# Patient Record
Sex: Male | Born: 1985 | Race: Black or African American | Hispanic: No | Marital: Single | Smoking: Never smoker
Health system: Southern US, Community
[De-identification: ages and names within clinical notes are randomized; demographics above are authoritative.]

## PROBLEM LIST (undated history)

## (undated) DIAGNOSIS — I10 Essential (primary) hypertension: Secondary | ICD-10-CM

---

## 2016-01-28 ENCOUNTER — Emergency Department (HOSPITAL_COMMUNITY)
Admission: EM | Admit: 2016-01-28 | Discharge: 2016-01-28 | Disposition: A | Discharge status: Home / Self Care | Payer: Self-pay | Attending: Emergency Medicine | Admitting: Emergency Medicine

## 2016-01-28 ENCOUNTER — Encounter (HOSPITAL_COMMUNITY): Payer: Self-pay | Admitting: Family Medicine

## 2016-01-28 DIAGNOSIS — A64 Unspecified sexually transmitted disease: Secondary | ICD-10-CM | POA: Insufficient documentation

## 2016-01-28 LAB — URINALYSIS, ROUTINE W REFLEX MICROSCOPIC
Bilirubin Urine: NEGATIVE
Glucose, UA: NEGATIVE mg/dL
Ketones, ur: 40 mg/dL — AB
NITRITE: NEGATIVE
PROTEIN: NEGATIVE mg/dL
Specific Gravity, Urine: 1.029 (ref 1.005–1.030)
pH: 5.5 (ref 5.0–8.0)

## 2016-01-28 LAB — URINE MICROSCOPIC-ADD ON: SQUAMOUS EPITHELIAL / LPF: NONE SEEN

## 2016-01-28 MED ORDER — AZITHROMYCIN 250 MG PO TABS
1000.0000 mg | ORAL_TABLET | Freq: Once | ORAL | Status: AC
  Administered 2016-01-28: 1000 mg via ORAL
  Filled 2016-01-28: qty 4

## 2016-01-28 MED ORDER — STERILE WATER FOR INJECTION IJ SOLN
INTRAMUSCULAR | Status: AC
  Filled 2016-01-28: qty 10

## 2016-01-28 MED ORDER — CEFTRIAXONE SODIUM 250 MG IJ SOLR
250.0000 mg | Freq: Once | INTRAMUSCULAR | Status: AC
  Administered 2016-01-28: 250 mg via INTRAMUSCULAR
  Filled 2016-01-28: qty 250

## 2016-01-28 NOTE — ED Provider Notes (Signed)
CSN: [id number]     Arrival date & time 01/28/16  1910 History  By signing my name below, I, Iona Beard, attest that this documentation has been prepared under the direction and in the presence of [employer], PA-C.  Electronically Signed: Iona Beard, ED Scribe 01/28/2016 at 8:54 PM.  Chief Complaint  Patient presents with  . Penile Discharge    The history is provided by the patient. No language interpreter was used.   HPI Comments: Brandon Cole is a 30 y.o. male who presents to the Emergency Department complaining of possible STD exposure after having unprotected sex with a male, two days ago. Pt reports associated dysuria and thin, brown-colored penile discharge. No other associated symptoms noted. No worsening or alleviating factors noted. Pt denies abdominal pain, testicular pain, penile pain, rectal pain, or any other pertinent symptoms. No history of STD.  History reviewed. No pertinent past medical history. History reviewed. No pertinent past surgical history. History reviewed. No pertinent family history. Social History  Substance Use Topics  . Smoking status: Never Smoker   . Smokeless tobacco: None  . Alcohol Use: Yes     Comment: Every 6 months.     Review of Systems A complete 10 system review of systems was obtained and all systems are negative except as noted in the HPI and PMH.   Allergies  Review of patient's allergies indicates not on file.  Home Medications   Prior to Admission medications   Not on File   BP 106/77 mmHg  Pulse 110  Temp(Src) 98.6 F (37 C) (Oral)  Resp 18  Ht  (1.702 m)  Wt 220 lb (99.791 kg)  BMI 34.45 kg/m2  SpO2 100% Physical Exam  Constitutional: He appears well-developed and well-nourished. No distress.  HENT:  Head: Normocephalic and atraumatic.  Mouth/Throat: Oropharynx is clear and moist.  Eyes: Conjunctivae and EOM are normal.  Neck: Neck supple. No tracheal deviation present.  Cardiovascular:  Normal rate, regular rhythm and normal heart sounds.  Exam reveals no gallop.   No murmur heard. Pulmonary/Chest: Effort normal and breath sounds normal. No respiratory distress. He has no wheezes. He has no rales.  Abdominal: Soft. There is no tenderness.  Genitourinary: Testes normal. Right testis shows no tenderness. Left testis shows no tenderness. No penile tenderness. Discharge found.  Mucopurulent discharge from urethral meatus. No other lesions or deformities  Musculoskeletal: Normal range of motion.  Neurological: He is alert.  Skin: Skin is warm and dry.  Psychiatric: He has a normal mood and affect. His behavior is normal.    ED Course  Procedures (including critical care time) DIAGNOSTIC STUDIES: Oxygen Saturation is 100% on RA, normal by my interpretation.    COORDINATION OF CARE: 8:50 PM Discussed treatment plan which includes urinalysis, rocephin, and Zithromax with pt at bedside and pt agreed to plan.  Labs Review Labs Reviewed  URINALYSIS, ROUTINE W REFLEX MICROSCOPIC (NOT AT [facility]) - Abnormal; Notable for the following:    APPearance CLOUDY (*)    Hgb urine dipstick SMALL (*)    Ketones, ur 40 (*)    Leukocytes, UA LARGE (*)    All other components within normal limits  URINE MICROSCOPIC-ADD ON - Abnormal; Notable for the following:    Bacteria, UA RARE (*)    All other components within normal limits  RPR  HIV ANTIBODY (ROUTINE TESTING)  GC/CHLAMYDIA PROBE AMP () NOT AT [facility]    [id number] No results found. I have personally reviewed  and evaluated these lab results as part of my medical decision-making.   EKG Interpretation None      MDM  Patient presents for evaluation of exposure to STI. He has mucopurulent penile discharge, no testicular pain, swelling or erythema. No evidence of orchitis. Treated empirically in the emergency Department for STI with Rocephin and azithromycin. Physical exam is otherwise unremarkable. No tachycardia on my  exam, initial tachycardia likely secondary to anxiety. Encourage follow-up with health Department as well as safe sexual practices. Overall appears well, nontoxic and appropriate for discharge. Final diagnoses:  STI (sexually transmitted infection)   I personally performed the services described in this documentation, which was scribed in my presence. The recorded information has been reviewed and is accurate.     Joycie PeekBenjamin Katalin Colledge, PA-C 01/28/16 2056  Arby BarretteMarcy Pfeiffer, MD 02/01/16 [id number]

## 2016-01-28 NOTE — Discharge Instructions (Signed)
You were treated in the emergency department for sexual transmitted disease. You still have tests pending, if these are positive, he may need to return for further treatment and inform your sexual partners that they will need to be treated also. Please practice safe sexual practices.  Sexually Transmitted Disease A sexually transmitted disease (STD) is a disease or infection that may be passed (transmitted) from person to person, usually during sexual activity. This may happen by way of saliva, semen, blood, vaginal mucus, or urine. Common STDs include:  Gonorrhea.  Chlamydia.  Syphilis.  HIV and AIDS.  Genital herpes.  Hepatitis B and C.  Trichomonas.  Human papillomavirus (HPV).  Pubic lice.  Scabies.  Mites.  Bacterial vaginosis. WHAT ARE CAUSES OF STDs? An STD may be caused by bacteria, a virus, or parasites. STDs are often transmitted during sexual activity if one person is infected. However, they may also be transmitted through nonsexual means. STDs may be transmitted after:   Sexual intercourse with an infected person.  Sharing sex toys with an infected person.  Sharing needles with an infected person or using unclean piercing or tattoo needles.  Having intimate contact with the genitals, mouth, or rectal areas of an infected person.  Exposure to infected fluids during birth. WHAT ARE THE SIGNS AND SYMPTOMS OF STDs? Different STDs have different symptoms. Some people may not have any symptoms. If symptoms are present, they may include:  Painful or bloody urination.  Pain in the pelvis, abdomen, vagina, anus, throat, or eyes.  A skin rash, itching, or irritation.  Growths, ulcerations, blisters, or sores in the genital and anal areas.  Abnormal vaginal discharge with or without bad odor.  Penile discharge in men.  Fever.  Pain or bleeding during sexual intercourse.  Swollen glands in the groin area.  Yellow skin and eyes (jaundice). This is seen with  hepatitis.  Swollen testicles.  Infertility.  Sores and blisters in the mouth. HOW ARE STDs DIAGNOSED? To make a diagnosis, your health care provider may:  Take a medical history.  Perform a physical exam.  Take a sample of any discharge to examine.  Swab the throat, cervix, opening to the penis, rectum, or vagina for testing.  Test a sample of your first morning urine.  Perform blood tests.  Perform a Pap test, if this applies.  Perform a colposcopy.  Perform a laparoscopy. HOW ARE STDs TREATED? Treatment depends on the STD. Some STDs may be treated but not cured.  Chlamydia, gonorrhea, trichomonas, and syphilis can be cured with antibiotic medicine.  Genital herpes, hepatitis, and HIV can be treated, but not cured, with prescribed medicines. The medicines lessen symptoms.  Genital warts from HPV can be treated with medicine or by freezing, burning (electrocautery), or surgery. Warts may come back.  HPV cannot be cured with medicine or surgery. However, abnormal areas may be removed from the cervix, vagina, or vulva.  If your diagnosis is confirmed, your recent sexual partners need treatment. This is true even if they are symptom-free or have a negative culture or evaluation. They should not have sex until their health care providers say it is okay.  Your health care provider may test you for infection again 3 months after treatment. HOW CAN I REDUCE MY RISK OF GETTING AN STD? Take these steps to reduce your risk of getting an STD:  Use latex condoms, dental dams, and water-soluble lubricants during sexual activity. Do not use petroleum jelly or oils.  Avoid having multiple sex partners.  Do not have sex with someone who has other sex partners  Do not have sex with anyone you do not know or who is at high risk for an STD.  Avoid risky sex practices that can break your skin.  Do not have sex if you have open sores on your mouth or skin.  Avoid drinking too much  alcohol or taking illegal drugs. Alcohol and drugs can affect your judgment and put you in a vulnerable position.  Avoid engaging in oral and anal sex acts.  Get vaccinated for HPV and hepatitis. If you have not received these vaccines in the past, talk to your health care provider about whether one or both might be right for you.  If you are at risk of being infected with HIV, it is recommended that you take a prescription medicine daily to prevent HIV infection. This is called pre-exposure prophylaxis (PrEP). You are considered at risk if:  You are a man who has sex with other men (MSM).  You are a heterosexual man or woman and are sexually active with more than one partner.  You take drugs by injection.  You are sexually active with a partner who has HIV.  Talk with your health care provider about whether you are at high risk of being infected with HIV. If you choose to begin PrEP, you should first be tested for HIV. You should then be tested every 3 months for as long as you are taking PrEP. WHAT SHOULD I DO IF I THINK I HAVE AN STD?  See your health care provider.  Tell your sexual partner(s). They should be tested and treated for any STDs.  Do not have sex until your health care provider says it is okay. WHEN SHOULD I GET IMMEDIATE MEDICAL CARE? Contact your health care provider right away if:   You have severe abdominal pain.  You are a man and notice swelling or pain in your testicles.  You are a woman and notice swelling or pain in your vagina.   This information is not intended to replace advice given to you by your health care provider. Make sure you discuss any questions you have with your health care provider.   Document Released: 10/24/2002 Document Revised: 08/24/2014 Document Reviewed: 02/21/2013 Elsevier Interactive Patient Education [employer]

## 2016-01-28 NOTE — ED Notes (Signed)
Patient reports he is experiencing penis discharge. Reports discharge being brownish and thin. Also, reports burning with urination. Pt reports he is sexually active with one partner and reports he had sex one time with condom breaking.

## 2016-01-29 LAB — GC/CHLAMYDIA PROBE AMP (~~LOC~~) NOT AT ARMC
CHLAMYDIA, DNA PROBE: POSITIVE — AB
NEISSERIA GONORRHEA: POSITIVE — AB

## 2016-01-30 ENCOUNTER — Telehealth (HOSPITAL_BASED_OUTPATIENT_CLINIC_OR_DEPARTMENT_OTHER): Payer: Self-pay | Admitting: Emergency Medicine

## 2016-02-02 ENCOUNTER — Telehealth (HOSPITAL_COMMUNITY): Payer: Self-pay

## 2016-02-02 NOTE — Telephone Encounter (Signed)
Pt calling for STD results.  ID verified x 2.  Pt informed both Gonorrhea and Chlamydia are negative. 

## 2016-02-27 ENCOUNTER — Telehealth: Payer: Self-pay | Admitting: *Deleted

## 2016-02-27 NOTE — Telephone Encounter (Signed)
Pt inquiring about HIV results.  Informed he has not been tested at this facility for HIV.  Referred to health dept.

## 2016-03-12 ENCOUNTER — Encounter (HOSPITAL_COMMUNITY): Payer: Self-pay | Admitting: Emergency Medicine

## 2016-03-12 ENCOUNTER — Emergency Department (HOSPITAL_COMMUNITY)
Admission: EM | Admit: 2016-03-12 | Discharge: 2016-03-13 | Disposition: A | Discharge status: Home / Self Care | Payer: Managed Care, Other (non HMO) | Attending: Emergency Medicine | Admitting: Emergency Medicine

## 2016-03-12 DIAGNOSIS — Z79891 Long term (current) use of opiate analgesic: Secondary | ICD-10-CM | POA: Insufficient documentation

## 2016-03-12 DIAGNOSIS — R42 Dizziness and giddiness: Secondary | ICD-10-CM | POA: Diagnosis present

## 2016-03-12 LAB — CBC WITH DIFFERENTIAL/PLATELET
BASOS PCT: 0 %
Basophils Absolute: 0 10*3/uL (ref 0.0–0.1)
EOS ABS: 0 10*3/uL (ref 0.0–0.7)
Eosinophils Relative: 1 %
HCT: 44.6 % (ref 39.0–52.0)
HEMOGLOBIN: 15.6 g/dL (ref 13.0–17.0)
Lymphocytes Relative: 27 %
Lymphs Abs: 1.9 10*3/uL (ref 0.7–4.0)
MCH: 31.1 pg (ref 26.0–34.0)
MCHC: 35 g/dL (ref 30.0–36.0)
MCV: 89 fL (ref 78.0–100.0)
Monocytes Absolute: 0.4 10*3/uL (ref 0.1–1.0)
Monocytes Relative: 5 %
NEUTROS PCT: 67 %
Neutro Abs: 5 10*3/uL (ref 1.7–7.7)
Platelets: 274 10*3/uL (ref 150–400)
RBC: 5.01 MIL/uL (ref 4.22–5.81)
RDW: 12.3 % (ref 11.5–15.5)
WBC: 7.3 10*3/uL (ref 4.0–10.5)

## 2016-03-12 LAB — COMPREHENSIVE METABOLIC PANEL
ALK PHOS: 77 U/L (ref 38–126)
ALT: 107 U/L — AB (ref 17–63)
AST: 51 U/L — ABNORMAL HIGH (ref 15–41)
Albumin: 4.6 g/dL (ref 3.5–5.0)
Anion gap: 10 (ref 5–15)
BUN: 16 mg/dL (ref 6–20)
CALCIUM: 9.6 mg/dL (ref 8.9–10.3)
CO2: 22 mmol/L (ref 22–32)
CREATININE: 1 mg/dL (ref 0.61–1.24)
Chloride: 106 mmol/L (ref 101–111)
Glucose, Bld: 85 mg/dL (ref 65–99)
Potassium: 3.6 mmol/L (ref 3.5–5.1)
Sodium: 138 mmol/L (ref 135–145)
Total Bilirubin: 0.7 mg/dL (ref 0.3–1.2)
Total Protein: 8.1 g/dL (ref 6.5–8.1)

## 2016-03-12 LAB — URINALYSIS, ROUTINE W REFLEX MICROSCOPIC
BILIRUBIN URINE: NEGATIVE
GLUCOSE, UA: NEGATIVE mg/dL
Hgb urine dipstick: NEGATIVE
Ketones, ur: NEGATIVE mg/dL
Leukocytes, UA: NEGATIVE
NITRITE: NEGATIVE
Protein, ur: NEGATIVE mg/dL
SPECIFIC GRAVITY, URINE: 1.029 (ref 1.005–1.030)
pH: 6 (ref 5.0–8.0)

## 2016-03-12 NOTE — ED Provider Notes (Signed)
[facility]-EMERGENCY DEPT Provider Note   CSN: [id number] Arrival date & time: 03/12/16  4098  First Provider Contact:  First MD Initiated Contact with Patient 03/12/16 2150        History   Chief Complaint Chief Complaint  Patient presents with  . Dizziness    HPI Brandon Cole is a 30 y.o. male.  HPI Patient states he's felt anxious ever since he got diagnosed with his STD about a month ago. He reports he thinks that's contributing to some of his symptoms. He reports when he is at work he sometimes feels dizzy and lightheaded. 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 He denies he gets those symptoms otherwise. He also reports he's been worried about of small nodule on his throat. He has noticed a slightly firm area above his Adam's apple and it moves when he swallows. History reviewed. No pertinent past medical history.  There are no active problems to display for this patient.   History reviewed. No pertinent surgical history.     Home Medications    Prior to Admission medications   Medication Sig Start Date End Date Taking? Authorizing Provider  acetaminophen (TYLENOL) 500 MG tablet Take 500 mg by mouth every 6 (six) hours as needed for mild pain.    Yes Historical Provider, MD    Family History Family History  Problem Relation Age of Onset  . Hypertension Other   . Diabetes Other     Social History Social History  Substance Use Topics  . Smoking status: Never Smoker  . Smokeless tobacco: Never Used  . Alcohol use Yes     Comment: Every 6 months.      Allergies   Review of patient's allergies indicates no known allergies.   Review of Systems Review of Systems 10 Systems reviewed and are negative for acute change except as noted in the HPI.  Physical Exam Updated Vital Signs BP (!) 154/108 (BP Location: Right Arm)   Pulse 94   Temp 99.7 F (37.6 C) (Oral)   Resp 20   Ht  (1.702 m)   Wt 215 lb (97.5 kg)   SpO2  99%   BMI 33.67 kg/m   Physical Exam  Constitutional: He is oriented to person, place, and time. He appears well-developed and well-nourished. No distress.  HENT:  Head: Normocephalic and atraumatic.  Nose: Nose normal.  Mouth/Throat: Oropharynx is clear and moist.  Eyes: EOM are normal. Pupils are equal, round, and reactive to light.  Neck: Neck supple. No tracheal deviation present. No thyromegaly present.  Cardiovascular: Normal rate, regular rhythm, normal heart sounds and intact distal pulses.   Pulmonary/Chest: Effort normal and breath sounds normal. No stridor.  Abdominal: Soft. Bowel sounds are normal. He exhibits no distension. There is no tenderness.  Musculoskeletal: Normal range of motion.  Lymphadenopathy:    He has no cervical adenopathy.  Neurological: He is alert and oriented to person, place, and time. No cranial nerve deficit. He exhibits normal muscle tone. Coordination normal.  Skin: Skin is warm and dry.  Psychiatric: He has a normal mood and affect.     ED Treatments / Results  Labs (all labs ordered are listed, but only abnormal results are displayed) Labs Reviewed  COMPREHENSIVE METABOLIC PANEL - Abnormal; Notable for the following:       Result Value   AST 51 (*)    ALT 107 (*)    All other components within normal limits  CBC WITH DIFFERENTIAL/PLATELET  URINALYSIS, ROUTINE W REFLEX MICROSCOPIC (NOT AT [facility])  RPR  HIV ANTIBODY (ROUTINE TESTING)  HEPATITIS PANEL, ACUTE    EKG  EKG Interpretation None       Radiology No results found.  Procedures Procedures (including critical care time)  Medications Ordered in ED Medications - No data to display   Initial Impression / Assessment and Plan / ED Course  I have reviewed the triage vital signs and the nursing notes.  Pertinent labs & imaging results that were available during my care of the patient were reviewed by me and considered in my medical decision making (see chart for  details).  Clinical Course     Final Clinical Impressions(s) / ED Diagnoses   Final diagnoses:  Dizziness  Patient has concerns ever since he had diagnosed with STDs about a month ago. Clinically he is well in appearance. Diagnostic workup is normal except for mild elevation in LFTs. Additional labs of HIV, hepatitis panel and RPR have been added. At this time I do not have clinical suspicion of these diagnoses. The patient as well. He also is concern for nodule on his anterior neck. This however does feel like a slightly prominent tracheal cartilage, at this time, no suspicion of abscess or significant soft tissue mass. Patient will be instructed to follow-up for further assessment of this.  New Prescriptions New Prescriptions   No medications on file     Arby Barrette, MD 03/13/16 [id number]

## 2016-03-12 NOTE — ED Triage Notes (Signed)
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

## 2016-03-13 LAB — HIV ANTIBODY (ROUTINE TESTING W REFLEX): HIV SCREEN 4TH GENERATION: NONREACTIVE

## 2016-03-13 LAB — RPR: RPR: NONREACTIVE

## 2016-03-13 NOTE — Discharge Instructions (Signed)
You need a family doctor to follow up on your concerns for a nodule in your neck. At this time, this does not appear be an emergent condition but if you develop rapid swelling, increasing pain, difficulty swallowing you must return to the emergency department. You also will need follow-up regarding a mild elevation in your liver tests, HIV and hepatitis test.

## 2016-03-14 LAB — HEPATITIS PANEL, ACUTE
HCV AB: 0.1 {s_co_ratio} (ref 0.0–0.9)
HEP A IGM: NEGATIVE
HEP B S AG: NEGATIVE
Hep B C IgM: NEGATIVE

## 2016-11-06 ENCOUNTER — Emergency Department (HOSPITAL_BASED_OUTPATIENT_CLINIC_OR_DEPARTMENT_OTHER)
Admission: EM | Admit: 2016-11-06 | Discharge: 2016-11-06 | Disposition: A | Discharge status: Home / Self Care | Payer: Managed Care, Other (non HMO) | Attending: Emergency Medicine | Admitting: Emergency Medicine

## 2016-11-06 ENCOUNTER — Encounter (HOSPITAL_BASED_OUTPATIENT_CLINIC_OR_DEPARTMENT_OTHER): Payer: Self-pay | Admitting: Emergency Medicine

## 2016-11-06 ENCOUNTER — Other Ambulatory Visit: Payer: Self-pay

## 2016-11-06 DIAGNOSIS — R51 Headache: Secondary | ICD-10-CM | POA: Diagnosis present

## 2016-11-06 DIAGNOSIS — I10 Essential (primary) hypertension: Secondary | ICD-10-CM | POA: Diagnosis not present

## 2016-11-06 NOTE — ED Triage Notes (Signed)
Patient reports that stiffness to right head and neck. The pateint reports that he checked his BP and it has been elevated and having palpitations

## 2016-11-06 NOTE — ED Provider Notes (Signed)
[facility]   CSN: [id number] Arrival date & time: 11/06/16  1529     History   Chief Complaint Chief Complaint  Patient presents with  . Headache    HPI Brandon Cole is a 31 y.o. male.  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 He presents with complaints of elevated blood pressure and "not quite feeling right in the head". He reports mild pressure in the back of his head, however no blurry vision, dizziness, nausea, weakness, tingling, or other complaints area he denies any injury or trauma. He denies any fevers or chills. He checked his blood pressure at [facility] and it was 160/100. He does not recall the last time he checked his blood pressure before that.   The history is provided by the patient.  Headache   This is a new problem. The current episode started 2 days ago. The problem occurs constantly. The problem has not changed since onset.The headache is associated with nothing. The pain is located in the occipital region. The quality of the pain is described as dull. The pain is mild. The pain does not radiate. He has tried nothing for the symptoms. The treatment provided no relief.    History reviewed. No pertinent past medical history.  There are no active problems to display for this patient.   History reviewed. No pertinent surgical history.     Home Medications    Prior to Admission medications   Medication Sig Start Date End Date Taking? Authorizing Provider  acetaminophen (TYLENOL) 500 MG tablet Take 500 mg by mouth every 6 (six) hours as needed for mild pain.     Historical Provider, MD    Family History Family History  Problem Relation Age of Onset  . Hypertension Other   . Diabetes Other     Social History Social History  Substance Use Topics  . Smoking status: Never Smoker  . Smokeless tobacco: Never Used  . Alcohol use Yes     Comment: Every 6 months.      Allergies   Patient has no  known allergies.   Review of Systems Review of Systems  Neurological: Positive for headaches.  All other systems reviewed and are negative.    Physical Exam Updated Vital Signs BP (!) 166/98 (BP Location: Right Arm)   Pulse (!) 110   Temp 99 F (37.2 C) (Oral)   Resp 18   Ht 5\' 7"  (1.702 m)   Wt 225 lb (102.1 kg)   SpO2 100%   BMI 35.24 kg/m   Physical Exam  Constitutional: He is oriented to person, place, and time. He appears well-developed and well-nourished. No distress.  HENT:  Head: Normocephalic and atraumatic.  Mouth/Throat: Oropharynx is clear and moist.  Neck: Normal range of motion. Neck supple.  Cardiovascular: Normal rate and regular rhythm.  Exam reveals no friction rub.   No murmur heard. Pulmonary/Chest: Effort normal and breath sounds normal. No respiratory distress. He has no wheezes. He has no rales.  Abdominal: Soft. Bowel sounds are normal. He exhibits no distension. There is no tenderness.  Musculoskeletal: Normal range of motion. He exhibits no edema.  Neurological: He is alert and oriented to person, place, and time. Coordination normal.  Skin: Skin is warm and dry. He is not diaphoretic.  Nursing note and vitals reviewed.    ED Treatments / Results  Labs (all labs ordered are listed, but only abnormal results are displayed) Labs Reviewed - No data  to display  EKG  EKG Interpretation  Date/Time:  Friday November 06 2016 15:40:32 EDT Ventricular Rate:  113 PR Interval:  154 QRS Duration: 80 QT Interval:  314 QTC Calculation: 430 R Axis:   14 Text Interpretation:  Sinus tachycardia Abnormal ECG Confirmed by Juanangel Soderholm  MD, Kaden Dunkel ([id number]) on 11/06/2016 4:06:45 PM       Radiology No results found.  Procedures Procedures (including critical care time)  Medications Ordered in ED Medications - No data to display   Initial Impression / Assessment and Plan / ED Course  I have reviewed the triage vital signs and the nursing  notes.  Pertinent labs & imaging results that were available during my care of the patient were reviewed by me and considered in my medical decision making (see chart for details).  Patient presents with complaints of elevated blood pressure and "fullness in the head". His blood pressure today is 160/100 and he is neurologically intact. The patient has no other complaints and I see no indication for head CT or workup at this time. My recommendations will be for follow-up with a primary physician to discuss his blood pressures. I've also advised him to keep a record of this for the next week. He was also advised on dietary modifications and exercise which may help and is likely to be the first line of treatment prior to initiating medications.  Final Clinical Impressions(s) / ED Diagnoses   Final diagnoses:  None    New Prescriptions New Prescriptions   No medications on file     Geoffery Lyonsouglas Raymond Azure, MD 11/06/16 1655

## 2016-11-06 NOTE — Discharge Instructions (Signed)
Keep a record of your blood pressures over the next week, and follow-up with a primary Dr. to discuss these results.

## 2016-12-05 ENCOUNTER — Encounter (HOSPITAL_BASED_OUTPATIENT_CLINIC_OR_DEPARTMENT_OTHER): Payer: Self-pay | Admitting: *Deleted

## 2016-12-05 ENCOUNTER — Emergency Department (HOSPITAL_BASED_OUTPATIENT_CLINIC_OR_DEPARTMENT_OTHER)
Admission: EM | Admit: 2016-12-05 | Discharge: 2016-12-05 | Disposition: A | Discharge status: Home / Self Care | Payer: Managed Care, Other (non HMO) | Attending: Emergency Medicine | Admitting: Emergency Medicine

## 2016-12-05 DIAGNOSIS — I10 Essential (primary) hypertension: Secondary | ICD-10-CM | POA: Diagnosis not present

## 2016-12-05 DIAGNOSIS — J358 Other chronic diseases of tonsils and adenoids: Secondary | ICD-10-CM

## 2016-12-05 DIAGNOSIS — J039 Acute tonsillitis, unspecified: Secondary | ICD-10-CM | POA: Insufficient documentation

## 2016-12-05 HISTORY — DX: Essential (primary) hypertension: I10

## 2016-12-05 MED ORDER — AMLODIPINE BESYLATE 10 MG PO TABS: 10.0000 mg | ORAL_TABLET | Freq: Every day | ORAL | 1 refills | Status: DC

## 2016-12-05 NOTE — Discharge Instructions (Signed)
You appear to have a tonsillith. These are "stones" of bacteria and cells they get trapped in the pits of the tonsils. Gargle twice daily with a solution of half water and half hydrogen peroxide. Gargle and swish vigorously for a few minutes. If it does not loosen and remove in the next week,Schedule as appointment with the Ear, Nose, Throat specialist listed in your discharge instrustions.

## 2016-12-05 NOTE — ED Provider Notes (Signed)
[facility]   CSN: [id number] Arrival date & time: 12/05/16  1414     History   Chief Complaint Chief Complaint  Patient presents with  . pus-like area on tonsil    HPI Brandon Cole is a 31 y.o. male.  HPI Patient states he has had a yellow spot on his right tonsil for about a month and a half. He reports it doesn't usually give him too much trouble. Sometimes it feels a little sore and swollen in the area. No fevers or chills. No nasal congestion or drainage.  Patient did not present with complaint of hypertension but his blood pressure noted to be significantly elevated. He reports he knew he had high blood pressure but he has not had specific treatment. He states that one time he was informed of it and instructed to start diet and exercise. He reports he thinks on the problem is his diet and physical deconditioning. Patient does not recall ever being on a medication. He denies any symptoms. Past Medical History:  Diagnosis Date  . Hypertension     There are no active problems to display for this patient.   History reviewed. No pertinent surgical history.     Home Medications    Prior to Admission medications   Medication Sig Start Date End Date Taking? Authorizing Provider  acetaminophen (TYLENOL) 500 MG tablet Take 500 mg by mouth every 6 (six) hours as needed for mild pain.     Historical Provider, MD  amLODipine (NORVASC) 10 MG tablet Take 1 tablet (10 mg total) by mouth daily. 12/05/16   Arby Barrette, MD    Family History Family History  Problem Relation Age of Onset  . Hypertension Other   . Diabetes Other     Social History Social History  Substance Use Topics  . Smoking status: Never Smoker  . Smokeless tobacco: Never Used  . Alcohol use No     Allergies   Patient has no known allergies.   Review of Systems Review of Systems 10 Systems reviewed and are negative for acute change except as noted in the  HPI.  Physical Exam Updated Vital Signs BP (!) 169/123 Comment: has hypertension but not on any BP medication   Pulse 83   Temp 98.2 F (36.8 C) (Oral)   Resp 18   Ht  (1.702 m)   Wt 225 lb (102.1 kg)   SpO2 100%   BMI 35.24 kg/m   Physical Exam  Constitutional: He is oriented to person, place, and time. He appears well-developed and well-nourished.  HENT:  Head: Normocephalic and atraumatic.  Dentition is in very good condition. Right tonsil has a 5 mm pearly yellow protrusion. With manipulation with a Q-tip I was not able to extrude it. Remainder of oral cavity in excellent condition.  Eyes: Conjunctivae and EOM are normal. Pupils are equal, round, and reactive to light.  Neck: Neck supple.  Cardiovascular: Normal rate and regular rhythm.   No murmur heard. Pulmonary/Chest: Effort normal and breath sounds normal. No respiratory distress.  Abdominal: Soft. There is no tenderness.  Musculoskeletal: Normal range of motion. He exhibits no edema.  Neurological: He is alert and oriented to person, place, and time. No cranial nerve deficit. He exhibits normal muscle tone. Coordination normal.  Skin: Skin is warm and dry.  Psychiatric: He has a normal mood and affect.  Nursing note and vitals reviewed.    ED Treatments / Results  Labs (all labs ordered are  listed, but only abnormal results are displayed) Labs Reviewed - No data to display  EKG  EKG Interpretation None       Radiology No results found.  Procedures Procedures (including critical care time)  Medications Ordered in ED Medications - No data to display   Initial Impression / Assessment and Plan / ED Course  I have reviewed the triage vital signs and the nursing notes.  Pertinent labs & imaging results that were available during my care of the patient were reviewed by me and considered in my medical decision making (see chart for details).      Final Clinical Impressions(s) / ED Diagnoses    Final diagnoses:  Tonsillith  Essential hypertension   Patient presented for evaluation of tonsillar stone. At this time is without associated erythema or swelling of the tonsil. Patient is counseled on session with half peroxide half water to try to extrude it. Advised to follow-up with ENT if it does not remove. Secondarily, the patient has significant hypertension. It appears this is been present that is not a new finding. He has been informed of her previously. Patient does not have signs of end organ damage. Patient is counseled on the grave importance of starting treatment and evaluation with a primary medical care provider for hypertension. He is a made aware of complications of heart attack stroke and kidney failure. He will be started on amlodipine daily and is given resources for establishing ongoing care. New Prescriptions Discharge Medication List as of 12/05/2016  3:13 PM    START taking these medications   Details  amLODipine (NORVASC) 10 MG tablet Take 1 tablet (10 mg total) by mouth daily., Starting Sat 12/05/2016, Print         Arby Barrette, MD 12/05/16 1524

## 2016-12-05 NOTE — ED Triage Notes (Signed)
Pt reports having small, pus-like area to R tonsil for approx 1.5 mos (hx of tonsil stones). Denies fever, sore throat, n/v/d. Reports R facial/eye pain that developed today. Of note: pt has HTN but is not taking meds; doesn't have PCP.

## 2016-12-05 NOTE — ED Notes (Signed)
Pt was called ([phone]) to notify of rx (Norvasc); no answer; VM was left requesting call back.

## 2016-12-05 NOTE — ED Notes (Signed)
Pt given d/c instructions as per chart. Verbalizes understanding. No questions. 

## 2018-08-05 ENCOUNTER — Emergency Department (HOSPITAL_BASED_OUTPATIENT_CLINIC_OR_DEPARTMENT_OTHER)
Admission: EM | Admit: 2018-08-05 | Discharge: 2018-08-06 | Disposition: A | Discharge status: Home / Self Care | Payer: Self-pay | Attending: Emergency Medicine | Admitting: Emergency Medicine

## 2018-08-05 ENCOUNTER — Emergency Department (HOSPITAL_BASED_OUTPATIENT_CLINIC_OR_DEPARTMENT_OTHER): Payer: Self-pay

## 2018-08-05 ENCOUNTER — Other Ambulatory Visit: Payer: Self-pay

## 2018-08-05 ENCOUNTER — Encounter (HOSPITAL_BASED_OUTPATIENT_CLINIC_OR_DEPARTMENT_OTHER): Payer: Self-pay

## 2018-08-05 DIAGNOSIS — Z79899 Other long term (current) drug therapy: Secondary | ICD-10-CM | POA: Insufficient documentation

## 2018-08-05 DIAGNOSIS — J209 Acute bronchitis, unspecified: Secondary | ICD-10-CM

## 2018-08-05 DIAGNOSIS — J029 Acute pharyngitis, unspecified: Secondary | ICD-10-CM | POA: Insufficient documentation

## 2018-08-05 DIAGNOSIS — I10 Essential (primary) hypertension: Secondary | ICD-10-CM | POA: Insufficient documentation

## 2018-08-05 DIAGNOSIS — R05 Cough: Secondary | ICD-10-CM | POA: Insufficient documentation

## 2018-08-05 LAB — GROUP A STREP BY PCR: Group A Strep by PCR: NOT DETECTED

## 2018-08-05 MED ORDER — DOXYCYCLINE HYCLATE 100 MG PO CAPS: 100.0000 mg | ORAL_CAPSULE | Freq: Two times a day (BID) | ORAL | 0 refills | Status: DC

## 2018-08-05 NOTE — ED Triage Notes (Signed)
C/o flu like sx x 3 weeks-NAD-steady gait 

## 2018-08-05 NOTE — Discharge Instructions (Addendum)
Doxycycline as prescribed.  Continue over-the-counter medications as needed for relief of symptoms.  Return to the emergency department if you develop any new or worsening symptoms.

## 2018-08-05 NOTE — ED Provider Notes (Signed)
[facility] EMERGENCY DEPARTMENT Provider Note   CSN: [id number] Arrival date & time: 08/05/18  2159     History   Chief Complaint Chief Complaint  Patient presents with  . Cough    HPI Alexander Mterrence Falter is a 32 y.o. male.  Patient is a 32 year old male with history of hypertension.  He presents with a three-week history of sore throat, cough that is intermittently productive of yellow sputum, and fever.  He denies any chest pain or difficulty breathing.  He denies any ill contacts.  The history is provided by the patient.  Cough  This is a new problem. Episode onset: 3 weeks ago. The problem occurs constantly. The problem has been gradually worsening. The cough is productive of sputum. There has been no fever. Pertinent negatives include no chest pain and no ear congestion. He is not a smoker.    Past Medical History:  Diagnosis Date  . Hypertension     There are no active problems to display for this patient.   History reviewed. No pertinent surgical history.      Home Medications    Prior to Admission medications   Medication Sig Start Date End Date Taking? Authorizing Provider  acetaminophen (TYLENOL) 500 MG tablet Take 500 mg by mouth every 6 (six) hours as needed for mild pain.     [provider]  amLODipine (NORVASC) 10 MG tablet Take 1 tablet (10 mg total) by mouth daily. 12/05/16   Arby BarrettePfeiffer, Marcy, MD    Family History Family History  Problem Relation Age of Onset  . Hypertension Other   . Diabetes Other     Social History Social History   Tobacco Use  . Smoking status: Never Smoker  . Smokeless tobacco: Never Used  Substance Use Topics  . Alcohol use: Yes    Comment: occ  . Drug use: No     Allergies   Patient has no known allergies.   Review of Systems Review of Systems  Respiratory: Positive for cough.   Cardiovascular: Negative for chest pain.  All other systems reviewed and are negative.    Physical  Exam Updated Vital Signs BP (!) 162/132 (BP Location: Left Arm) Comment: noncompliant  Pulse (!) 108   Temp 99.1 F (37.3 C) (Oral)   Resp 20   Ht 5\' 7"  (1.702 m)   Wt 113.4 kg   SpO2 98%   BMI 39.16 kg/m   Physical Exam Vitals signs and nursing note reviewed.  Constitutional:      General: He is not in acute distress.    Appearance: He is well-developed. He is not diaphoretic.  HENT:     Head: Normocephalic and atraumatic.     Right Ear: Tympanic membrane, ear canal and external ear normal.     Left Ear: Tympanic membrane, ear canal and external ear normal.     Mouth/Throat:     Mouth: Mucous membranes are moist.     Pharynx: Posterior oropharyngeal erythema present. No oropharyngeal exudate.  Neck:     Musculoskeletal: Normal range of motion and neck supple.  Cardiovascular:     Rate and Rhythm: Normal rate and regular rhythm.     Heart sounds: No murmur. No friction rub.  Pulmonary:     Effort: Pulmonary effort is normal. No respiratory distress.     Breath sounds: Normal breath sounds. No wheezing or rales.  Abdominal:     General: Bowel sounds are normal. There is no distension.  Palpations: Abdomen is soft.     Tenderness: There is no abdominal tenderness.  Musculoskeletal: Normal range of motion.  Skin:    General: Skin is warm and dry.  Neurological:     Mental Status: He is alert and oriented to person, place, and time.     Coordination: Coordination normal.      ED Treatments / Results  Labs (all labs ordered are listed, but only abnormal results are displayed) Labs Reviewed  GROUP A STREP BY PCR    EKG None  Radiology Dg Chest 2 View  Result Date: 08/05/2018 CLINICAL DATA:  Acute onset of cough and congestion. Body aches. EXAM: CHEST - 2 VIEW COMPARISON:  None. FINDINGS: The lungs are well-aerated and clear. There is no evidence of focal opacification, pleural effusion or pneumothorax. The heart is normal in size; the mediastinal contour is  within normal limits. No acute osseous abnormalities are seen. IMPRESSION: No acute cardiopulmonary process seen. Electronically Signed   By: Roanna RaiderJeffery  Chang M.D.   On: 08/05/2018 23:05    Procedures Procedures (including critical care time)  Medications Ordered in ED Medications - No data to display   Initial Impression / Assessment and Plan / ED Course  I have reviewed the triage vital signs and the nursing notes.  Pertinent labs & imaging results that were available during my care of the patient were reviewed by me and considered in my medical decision making (see chart for details).  Patient reports persistent URI symptoms for several weeks unrelieved with over-the-counter medications.  Due to the length of illness, he will be treated for presumed bacterial bronchitis.  To follow-up as needed if not improving.  Chest xray is clear, no hypoxia, no distress.  Final Clinical Impressions(s) / ED Diagnoses   Final diagnoses:  None    ED Discharge Orders    None       Geoffery Lyonselo, Ceairra Mccarver, MD 08/05/18 2320

## 2019-02-23 ENCOUNTER — Other Ambulatory Visit: Payer: Self-pay

## 2019-02-23 ENCOUNTER — Encounter (HOSPITAL_BASED_OUTPATIENT_CLINIC_OR_DEPARTMENT_OTHER): Payer: Self-pay | Admitting: *Deleted

## 2019-02-23 ENCOUNTER — Emergency Department (HOSPITAL_BASED_OUTPATIENT_CLINIC_OR_DEPARTMENT_OTHER)
Admission: EM | Admit: 2019-02-23 | Discharge: 2019-02-23 | Disposition: A | Discharge status: Home / Self Care | Payer: Self-pay | Attending: Emergency Medicine | Admitting: Emergency Medicine

## 2019-02-23 DIAGNOSIS — Z202 Contact with and (suspected) exposure to infections with a predominantly sexual mode of transmission: Secondary | ICD-10-CM | POA: Insufficient documentation

## 2019-02-23 DIAGNOSIS — I10 Essential (primary) hypertension: Secondary | ICD-10-CM | POA: Insufficient documentation

## 2019-02-23 DIAGNOSIS — Z79899 Other long term (current) drug therapy: Secondary | ICD-10-CM | POA: Insufficient documentation

## 2019-02-23 LAB — URINALYSIS, ROUTINE W REFLEX MICROSCOPIC
Bilirubin Urine: NEGATIVE
Glucose, UA: NEGATIVE mg/dL
Hgb urine dipstick: NEGATIVE
Ketones, ur: NEGATIVE mg/dL
Leukocytes,Ua: NEGATIVE
Nitrite: NEGATIVE
Protein, ur: NEGATIVE mg/dL
Specific Gravity, Urine: >1.03 — ABNORMAL HIGH (ref 1.005–1.030)
pH: 5.5 (ref 5.0–8.0)

## 2019-02-23 MED ORDER — AZITHROMYCIN 250 MG PO TABS
1000.0000 mg | ORAL_TABLET | Freq: Once | ORAL | Status: AC
  Administered 2019-02-23: 14:00:00 1000 mg via ORAL
  Filled 2019-02-23: qty 4

## 2019-02-23 MED ORDER — CEFTRIAXONE SODIUM 250 MG IJ SOLR
250.0000 mg | Freq: Once | INTRAMUSCULAR | Status: AC
Start: 2019-02-23 — End: 2019-02-23
  Administered 2019-02-23: 250 mg via INTRAMUSCULAR
  Filled 2019-02-23: qty 250

## 2019-02-23 NOTE — ED Triage Notes (Signed)
Penile discharge and dysuria.

## 2019-02-23 NOTE — Discharge Instructions (Addendum)
Thank you for allowing me to provide your care today in the emergency department.   It is very important that you follow-up with a primary care doctor.  Your blood pressure today is elevated and has been in the past.  You declined medications today to start treatment for your high blood pressure. Please establish care with a primary care doctor to have blood pressure rechecked within 1 week.  There are also lifestyle changes he can do including exercising and reducing salt intake.   Your gonorrhea, chlamydia,testing is pending.  You have already been treated for both.  Your syphilis and HIV test are also pending.  If any of these tests are positive, someone from the hospital will call you at the number that we discussed.  You can also download the my chart application and use the number on your discharge paperwork to register for an account.  Lab results are typically available in the app 72 hours after they have resulted.  [facility] recommends abstaining from all sexual activities for 7 days after being treated.  If any of your tests are positive, it is important that you let all of your sexual partners know so they can seek treatment.  It is also important to note that if you were treated and have sex with someone that has not been treated that you can be reinfected.  Return to the emergency department if you develop significantly worsening symptoms such as fever, chills, severe pain, redness, or swelling to the penis or testicles, or if the discharge from your penis does not improve.

## 2019-02-23 NOTE — ED Provider Notes (Signed)
[place] EMERGENCY DEPARTMENT Provider Note   CSN: [id number] Arrival date & time: 02/23/19  1303    History   Chief Complaint No chief complaint on file.   HPI Brandon Cole is a 33 y.o. male with past medical history of hypertension presents to emergency department today with chief complaint of penile discharge and dysuria x 2 days. He admits to being sexually active with 2 male partners without protection. He describes the discharge as thin consistency and yellow.  He admits to associated dysuria.  No other associated symptoms.  No worsening or alleviating factors.  Patient denies fever, abdominal pain, testicular pain, penile pain, rectal pain, back pain, flank pain, rash.  History provided by patient with additional history obtained from chart review.     Past Medical History:  Diagnosis Date  . Hypertension     There are no active problems to display for this patient.   History reviewed. No pertinent surgical history.      Home Medications    Prior to Admission medications   Medication Sig Start Date End Date Taking? Authorizing Provider  acetaminophen (TYLENOL) 500 MG tablet Take 500 mg by mouth every 6 (six) hours as needed for mild pain.     [provider]  doxycycline (VIBRAMYCIN) 100 MG capsule Take 1 capsule (100 mg total) by mouth 2 (two) times daily. One po bid x 7 days 08/05/18   Veryl Speak, MD  amLODipine (NORVASC) 10 MG tablet Take 1 tablet (10 mg total) by mouth daily. 12/05/16 02/23/19  Charlesetta Shanks, MD    Family History Family History  Problem Relation Age of Onset  . Hypertension Other   . Diabetes Other     Social History Social History   Tobacco Use  . Smoking status: Never Smoker  . Smokeless tobacco: Never Used  Substance Use Topics  . Alcohol use: Yes    Comment: occ  . Drug use: No     Allergies   Patient has no known allergies.   Review of Systems Review of Systems  Constitutional: Negative for  chills and fever.  Gastrointestinal: Negative for abdominal pain, nausea and vomiting.  Genitourinary: Positive for discharge and dysuria. Negative for flank pain, genital sores, hematuria, penile pain, penile swelling, scrotal swelling, testicular pain and urgency.  Allergic/Immunologic: Negative for immunocompromised state.     Physical Exam Updated Vital Signs BP (!) 148/116   Pulse (!) 110   Temp 98.8 F (37.1 C) (Oral)   Resp 20   Ht 5\' 7"  (1.702 m)   Wt 117.5 kg   SpO2 100%   BMI 40.57 kg/m   Physical Exam Vitals signs and nursing note reviewed.  Constitutional:      Appearance: He is well-developed. He is not ill-appearing or toxic-appearing.  HENT:     Head: Normocephalic and atraumatic.     Nose: Nose normal.  Eyes:     General: No scleral icterus.       Right eye: No discharge.        Left eye: No discharge.     Conjunctiva/sclera: Conjunctivae normal.  Neck:     Musculoskeletal: Normal range of motion.     Vascular: No JVD.  Cardiovascular:     Rate and Rhythm: Normal rate and regular rhythm.     Pulses: Normal pulses.     Heart sounds: Normal heart sounds.  Pulmonary:     Effort: Pulmonary effort is normal.     Breath sounds: Normal breath sounds.  Abdominal:     General: There is no distension.  Genitourinary:    Comments: Chaperone  Ikrane RN present for exam. Thin clear discharge noted. No urethritis. Uncircumcised.  No signs of sores or lesions or erythema on the penis or testicles. The penis and testicles are nontender. No testicular masses or swelling. No scrotal swelling. No signs of any inguinal hernias. Cremaster reflex present bilaterally. Musculoskeletal: Normal range of motion.  Skin:    General: Skin is warm and dry.  Neurological:     Mental Status: He is oriented to person, place, and time.     GCS: GCS eye subscore is 4. GCS verbal subscore is 5. GCS motor subscore is 6.     Comments: Fluent speech, no facial droop.  Psychiatric:         Behavior: Behavior normal.      ED Treatments / Results  Labs (all labs ordered are listed, but only abnormal results are displayed) Labs Reviewed  URINALYSIS, ROUTINE W REFLEX MICROSCOPIC - Abnormal; Notable for the following components:      Result Value   Specific Gravity, Urine >1.030 (*)    All other components within normal limits  URINE CULTURE  RPR  HIV ANTIBODY (ROUTINE TESTING W REFLEX)  GC/CHLAMYDIA PROBE AMP [facility]    EKG None  Radiology No results found.  Procedures Procedures (including critical care time)  Medications Ordered in ED Medications  cefTRIAXone (ROCEPHIN) injection 250 mg (250 mg Intramuscular Given 02/23/19 1427)  azithromycin (ZITHROMAX) tablet 1,000 mg (1,000 mg Oral Given 02/23/19 1428)     Initial Impression / Assessment and Plan / ED Course  I have reviewed the triage vital signs and the nursing notes.  Pertinent labs & imaging results that were available during my care of the patient were reviewed by me and considered in my medical decision making (see chart for details).   Patient is afebrile without abdominal tenderness, abdominal pain or painful bowel movements to indicate prostatitis.  No tenderness to palpation of the testes or epididymis to suggest orchitis or epididymitis.  UA negative for infection. STD cultures obtained including HIV, syphilis, gonorrhea and chlamydia. Patient to be discharged with instructions to follow up with PCP. Discussed importance of using protection when sexually active. Pt understands that they have GC/Chlamydia cultures pending and that they will need to inform all sexual partners if results return positive. Patient has been treated prophylactically with azithromycin and Rocephin.  His blood pressure is elevated today.  Chart review shows he has been seen for hypertension in the past.  He was started on amlodipine however when asked states he never took it because he does not want to put  medications in his body.  I discussed at length the importance of treating hypertension and the associated risks without treatment.  He continues to decline treatment.  I recommended close PCP follow-up and [facility]  Recommend to have his blood pressure rechecked within 1 week.  I checked patient's vitals and blood pressure slightly improved to 148/116 and heart rate of 104.  Strict ED return precautions discussed with patient and he reports understanding.  Overall patient appears well, nontoxic and appropriate for discharge.   This note was prepared using Dragon voice recognition software and may include unintentional dictation errors due to the inherent limitations of voice recognition software.    Final Clinical Impressions(s) / ED Diagnoses   Final diagnoses:  Possible exposure  to STD    ED Discharge Orders    None       Kathyrn Lasslbrizze, Sandrika Schwinn E, PA-C 02/23/19 1441    Jacalyn LefevreHaviland, Julie, MD 02/23/19 1444

## 2019-02-23 NOTE — ED Notes (Signed)
Pt given gingerale for PO challenge 

## 2019-02-24 LAB — URINE CULTURE: Culture: NO GROWTH

## 2019-02-24 LAB — RPR: RPR Ser Ql: NONREACTIVE

## 2019-02-24 LAB — GC/CHLAMYDIA PROBE AMP (~~LOC~~) NOT AT ARMC
Chlamydia: NEGATIVE
Neisseria Gonorrhea: NEGATIVE

## 2019-02-24 LAB — HIV ANTIBODY (ROUTINE TESTING W REFLEX): HIV Screen 4th Generation wRfx: NONREACTIVE

## 2019-02-28 ENCOUNTER — Other Ambulatory Visit: Payer: Self-pay

## 2019-02-28 ENCOUNTER — Encounter (HOSPITAL_BASED_OUTPATIENT_CLINIC_OR_DEPARTMENT_OTHER): Payer: Self-pay | Admitting: *Deleted

## 2019-02-28 ENCOUNTER — Emergency Department (HOSPITAL_BASED_OUTPATIENT_CLINIC_OR_DEPARTMENT_OTHER)
Admission: EM | Admit: 2019-02-28 | Discharge: 2019-03-01 | Disposition: A | Discharge status: Home / Self Care | Payer: Self-pay | Attending: Emergency Medicine | Admitting: Emergency Medicine

## 2019-02-28 DIAGNOSIS — Z79899 Other long term (current) drug therapy: Secondary | ICD-10-CM | POA: Insufficient documentation

## 2019-02-28 DIAGNOSIS — H10212 Acute toxic conjunctivitis, left eye: Secondary | ICD-10-CM | POA: Insufficient documentation

## 2019-02-28 DIAGNOSIS — I10 Essential (primary) hypertension: Secondary | ICD-10-CM | POA: Insufficient documentation

## 2019-02-28 DIAGNOSIS — N4889 Other specified disorders of penis: Secondary | ICD-10-CM

## 2019-02-28 DIAGNOSIS — N4829 Other inflammatory disorders of penis: Secondary | ICD-10-CM | POA: Insufficient documentation

## 2019-02-28 DIAGNOSIS — T5991XA Toxic effect of unspecified gases, fumes and vapors, accidental (unintentional), initial encounter: Secondary | ICD-10-CM | POA: Insufficient documentation

## 2019-02-28 MED ORDER — FLUORESCEIN SODIUM 1 MG OP STRP
ORAL_STRIP | OPHTHALMIC | Status: AC
  Administered 2019-03-01
  Filled 2019-02-28: qty 1

## 2019-02-28 MED ORDER — TETRACAINE HCL 0.5 % OP SOLN
OPHTHALMIC | Status: AC
  Filled 2019-02-28: qty 4

## 2019-02-28 NOTE — ED Provider Notes (Signed)
[place] DEPT [facility] Provider Note: Georgena Spurling, MD, FACEP  CSN: [id number] MRN: [id number] ARRIVAL: 02/28/19 at 2030 ROOM: [place]  Penile Swelling and Eye Pain   HISTORY OF PRESENT ILLNESS  02/28/19 11:20 PM Brandon Cole is a 33 y.o. male who was seen for a watery urethral discharge on the ninth of this month and treated with Rocephin and Zithromax for possible urethritis.  His GC/chlamydia testing was negative, HIV was negative, RPR was negative, urinalysis and urine culture were negative.   Sometime after being treated he noticed that the distal aspect of his penile skin was edematous.  This is more prominent on the right side of his penis.  There is no associated pain, swelling or erythema.  He thinks he may of caused this by masturbating.  He is also complaining of painless erythema and watering of his left eye which he attributes to chemical fume exposure 2 days ago.   Past Medical History:  Diagnosis Date  . Hypertension     History reviewed. No pertinent surgical history.  Family History  Problem Relation Age of Onset  . Hypertension Other   . Diabetes Other     Social History   Tobacco Use  . Smoking status: Never Smoker  . Smokeless tobacco: Never Used  Substance Use Topics  . Alcohol use: Yes    Comment: occ  . Drug use: No    Prior to Admission medications   Medication Sig Start Date End Date Taking? Authorizing Provider  acetaminophen (TYLENOL) 500 MG tablet Take 500 mg by mouth every 6 (six) hours as needed for mild pain.     [provider]  doxycycline (VIBRAMYCIN) 100 MG capsule Take 1 capsule (100 mg total) by mouth 2 (two) times daily. One po bid x 7 days 08/05/18   Veryl Speak, MD  amLODipine (NORVASC) 10 MG tablet Take 1 tablet (10 mg total) by mouth daily. 12/05/16 02/23/19  Charlesetta Shanks, MD    Allergies Patient has no known allergies.   REVIEW OF SYSTEMS  Negative except as noted here or in the  History of Present Illness.   PHYSICAL EXAMINATION  Initial Vital Signs Blood pressure (!) 151/101, pulse 90, temperature 98.2 F (36.8 C), temperature source Oral, resp. rate 16, height 5\' 7"  (1.702 m), weight 128.4 kg, SpO2 98 %.  Examination General: Well-developed, well-nourished male in no acute distress; appearance consistent with age of record HENT: normocephalic; atraumatic Eyes: pupils equal, round and reactive to light; extraocular muscles intact; left conjunctival injection without purulent discharge; fluorescein test negative left eye; pH left eye 8 Neck: supple Heart: regular rate and rhythm Lungs: clear to auscultation bilaterally Abdomen: soft; nondistended; nontender; bowel sounds present GU: Tanner V male, circumcised; no urethral discharge; nontender edema of the distal penile skin without fluctuance, erythema or warmth Extremities: No deformity; full range of motion; pulses normal Neurologic: Awake, alert and oriented; motor function intact in all extremities and symmetric; no facial droop Skin: Warm and dry Psychiatric: Normal mood and affect   RESULTS  Summary of this visit's results, reviewed by myself:   EKG Interpretation  Date/Time:    Ventricular Rate:    PR Interval:    QRS Duration:   QT Interval:    QTC Calculation:   R Axis:     Text Interpretation:        Laboratory Studies: No results found for this or any previous visit (from the past 24 hour(s)). Imaging Studies: No results  found.  ED COURSE and MDM  Nursing notes and initial vitals signs, including pulse oximetry, reviewed.  Vitals:   02/28/19 2042 02/28/19 2044  BP:  (!) 151/101  Pulse:  90  Resp:  16  Temp:  98.2 F (36.8 C)  TempSrc:  Oral  SpO2:  98%  Weight: 128.4 kg   Height: 5\' 7"  (1.702 m)    Patient advised to use over-the-counter saline drops for his likely chemical conjunctivitis.  The other possible cause would be viral conjunctivitis.  Antibiotic drops or  ointment are not indicated for either of these.  He was advised not to touch his left eye and then touches right eye as this could potentially spread a viral conjunctivitis.  The edema of his penis appears benign but will refer him to urology if symptoms persist or worsen.  PROCEDURES    ED DIAGNOSES     ICD-10-CM   1. Edema of penis  N48.89   2. Acute chemical conjunctivitis of left eye  H10.212        Paula LibraMolpus, Avalynne Diver, MD 02/28/19 2336

## 2019-02-28 NOTE — ED Triage Notes (Signed)
Redness, itching and drainage from his left eye. He also wants have a pimple on his penis checked. He had negative STD cultures a few days ago.

## 2020-10-03 ENCOUNTER — Emergency Department (HOSPITAL_BASED_OUTPATIENT_CLINIC_OR_DEPARTMENT_OTHER)
Admission: EM | Admit: 2020-10-03 | Discharge: 2020-10-04 | Disposition: A | Discharge status: Home / Self Care | Payer: No Typology Code available for payment source | Attending: Emergency Medicine | Admitting: Emergency Medicine

## 2020-10-03 ENCOUNTER — Encounter (HOSPITAL_BASED_OUTPATIENT_CLINIC_OR_DEPARTMENT_OTHER): Payer: Self-pay

## 2020-10-03 ENCOUNTER — Other Ambulatory Visit: Payer: Self-pay

## 2020-10-03 DIAGNOSIS — Z79899 Other long term (current) drug therapy: Secondary | ICD-10-CM | POA: Diagnosis not present

## 2020-10-03 DIAGNOSIS — R369 Urethral discharge, unspecified: Secondary | ICD-10-CM

## 2020-10-03 DIAGNOSIS — Z202 Contact with and (suspected) exposure to infections with a predominantly sexual mode of transmission: Secondary | ICD-10-CM | POA: Insufficient documentation

## 2020-10-03 DIAGNOSIS — I10 Essential (primary) hypertension: Secondary | ICD-10-CM | POA: Diagnosis not present

## 2020-10-03 NOTE — ED Triage Notes (Signed)
Pt presents with complaints of green penile discharge x 1 day. Has concerns for STD.

## 2020-10-04 LAB — URINALYSIS, ROUTINE W REFLEX MICROSCOPIC
Bilirubin Urine: NEGATIVE
Glucose, UA: NEGATIVE mg/dL
Hgb urine dipstick: NEGATIVE
Ketones, ur: NEGATIVE mg/dL
Leukocytes,Ua: NEGATIVE
Nitrite: NEGATIVE
Protein, ur: NEGATIVE mg/dL
Specific Gravity, Urine: 1.025 (ref 1.005–1.030)
pH: 6 (ref 5.0–8.0)

## 2020-10-04 MED ORDER — CEFTRIAXONE SODIUM 500 MG IJ SOLR
500.0000 mg | Freq: Once | INTRAMUSCULAR | Status: AC
  Administered 2020-10-04: 500 mg via INTRAMUSCULAR
  Filled 2020-10-04: qty 500

## 2020-10-04 MED ORDER — LIDOCAINE HCL (PF) 1 % IJ SOLN
INTRAMUSCULAR | Status: AC
  Filled 2020-10-04: qty 5

## 2020-10-04 MED ORDER — AZITHROMYCIN 250 MG PO TABS
1000.0000 mg | ORAL_TABLET | Freq: Once | ORAL | Status: AC
  Administered 2020-10-04: 1000 mg via ORAL
  Filled 2020-10-04: qty 4

## 2020-10-04 NOTE — ED Provider Notes (Signed)
[facility] EMERGENCY DEPARTMENT Provider Note   CSN: [id number] Arrival date & time: 10/03/20  2339     History Chief Complaint  Patient presents with  . Exposure to STD    Brandon Cole is a 35 y.o. male.  Brandon Cole is a 35 year old male with history of hypertension presenting with complaints of urethral discharge.  Patient reports recent sexual encounter with a new partner during which the condom broke, however he continued with intercourse.  2 days later, he began to notice a discharge on his underwear and some burning with urination.  He reports having gonorrhea in the past and this feels similar.  He denies any fevers or chills.  He denies any lumps, bumps, or rash.  The history is provided by the patient.  Exposure to STD This is a new problem. The current episode started 2 days ago. Exacerbated by: Urinating. Nothing relieves the symptoms.       Past Medical History:  Diagnosis Date  . Hypertension     There are no problems to display for this patient.   History reviewed. No pertinent surgical history.     Family History  Problem Relation Age of Onset  . Hypertension Other   . Diabetes Other     Social History   Tobacco Use  . Smoking status: Never Smoker  . Smokeless tobacco: Never Used  Vaping Use  . Vaping Use: Never used  Substance Use Topics  . Alcohol use: Not Currently    Comment: occ  . Drug use: No    Home Medications Prior to Admission medications   Medication Sig Start Date End Date Taking? Authorizing Provider  amLODipine (NORVASC) 10 MG tablet Take 1 tablet (10 mg total) by mouth daily. 12/05/16 02/23/19  Arby Barrette, MD    Allergies    Patient has no known allergies.  Review of Systems   Review of Systems  All other systems reviewed and are negative.   Physical Exam Updated Vital Signs BP (!) 176/126   Pulse (!) 105   Temp 98.4 F (36.9 C)   Resp 20   Ht 5\' 7"  (1.702 m)   Wt 109.3 kg   SpO2 98%   BMI  37.75 kg/m   Physical Exam Vitals and nursing note reviewed.  Constitutional:      General: He is not in acute distress.    Appearance: Normal appearance. He is not ill-appearing.  HENT:     Head: Normocephalic and atraumatic.  Pulmonary:     Effort: Pulmonary effort is normal.  Skin:    General: Skin is warm and dry.  Neurological:     Mental Status: He is alert and oriented to person, place, and time.     ED Results / Procedures / Treatments   Labs (all labs ordered are listed, but only abnormal results are displayed) Labs Reviewed  URINALYSIS, ROUTINE W REFLEX MICROSCOPIC  GC/CHLAMYDIA PROBE AMP ([place]) NOT AT [facility]    EKG None  Radiology No results found.  Procedures Procedures   Medications Ordered in ED Medications  cefTRIAXone (ROCEPHIN) injection 500 mg (has no administration in time range)  azithromycin (ZITHROMAX) tablet 1,000 mg (has no administration in time range)    ED Course  I have reviewed the triage vital signs and the nursing notes.  Pertinent labs & imaging results that were available during my care of the patient were reviewed by me and considered in my medical decision making (see chart for details).  MDM Rules/Calculators/A&P  Urinalysis is negative for UTI.  Patient with high suspicion for STD and will be treated with Rocephin and Zithromax.  Patient's GC and Chlamydia cultures are currently pending.  If positive, patient will be notified so that he can instruct his partner to be treated as well.  Final Clinical Impression(s) / ED Diagnoses Final diagnoses:  None    Rx / DC Orders ED Discharge Orders    None       Geoffery Lyons, MD 10/04/20 0005

## 2020-10-04 NOTE — Discharge Instructions (Addendum)
We will notify you if your cultures returned positive so that you can inform your contacts and have them treated as well.  Return to the emergency department if symptoms significantly worsen or change.

## 2020-10-07 LAB — GC/CHLAMYDIA PROBE AMP (~~LOC~~) NOT AT ARMC
Chlamydia: POSITIVE — AB
Comment: NEGATIVE
Comment: NORMAL
Neisseria Gonorrhea: NEGATIVE

## 2020-12-28 ENCOUNTER — Encounter (HOSPITAL_BASED_OUTPATIENT_CLINIC_OR_DEPARTMENT_OTHER): Payer: Self-pay | Admitting: Emergency Medicine

## 2020-12-28 ENCOUNTER — Other Ambulatory Visit: Payer: Self-pay

## 2020-12-28 ENCOUNTER — Emergency Department (HOSPITAL_BASED_OUTPATIENT_CLINIC_OR_DEPARTMENT_OTHER)
Admission: EM | Admit: 2020-12-28 | Discharge: 2020-12-28 | Disposition: A | Discharge status: Home / Self Care | Payer: No Typology Code available for payment source | Attending: Emergency Medicine | Admitting: Emergency Medicine

## 2020-12-28 DIAGNOSIS — I491 Atrial premature depolarization: Secondary | ICD-10-CM | POA: Insufficient documentation

## 2020-12-28 DIAGNOSIS — I10 Essential (primary) hypertension: Secondary | ICD-10-CM | POA: Insufficient documentation

## 2020-12-28 DIAGNOSIS — Z79899 Other long term (current) drug therapy: Secondary | ICD-10-CM | POA: Insufficient documentation

## 2020-12-28 LAB — BASIC METABOLIC PANEL
Anion gap: 10 (ref 5–15)
BUN: 16 mg/dL (ref 6–20)
CO2: 24 mmol/L (ref 22–32)
Calcium: 9.5 mg/dL (ref 8.9–10.3)
Chloride: 105 mmol/L (ref 98–111)
Creatinine, Ser: 1.07 mg/dL (ref 0.61–1.24)
GFR, Estimated: >60 mL/min (ref >60)
Glucose, Bld: 86 mg/dL (ref 70–99)
Potassium: 3.5 mmol/L (ref 3.5–5.1)
Sodium: 139 mmol/L (ref 135–145)

## 2020-12-28 LAB — CBC WITH DIFFERENTIAL/PLATELET
Abs Immature Granulocytes: 0.03 10*3/uL (ref 0.00–0.07)
Basophils Absolute: 0.1 10*3/uL (ref 0.0–0.1)
Basophils Relative: 1 %
Eosinophils Absolute: 0.1 10*3/uL (ref 0.0–0.5)
Eosinophils Relative: 1 %
HCT: 44.6 % (ref 39.0–52.0)
Hemoglobin: 15.1 g/dL (ref 13.0–17.0)
Immature Granulocytes: 0 %
Lymphocytes Relative: 37 %
Lymphs Abs: 2.9 10*3/uL (ref 0.7–4.0)
MCH: 30.6 pg (ref 26.0–34.0)
MCHC: 33.9 g/dL (ref 30.0–36.0)
MCV: 90.5 fL (ref 80.0–100.0)
Monocytes Absolute: 0.6 10*3/uL (ref 0.1–1.0)
Monocytes Relative: 8 %
Neutro Abs: 4.1 10*3/uL (ref 1.7–7.7)
Neutrophils Relative %: 53 %
Platelets: 308 10*3/uL (ref 150–400)
RBC: 4.93 MIL/uL (ref 4.22–5.81)
RDW: 12.2 % (ref 11.5–15.5)
WBC: 7.8 10*3/uL (ref 4.0–10.5)
nRBC: 0 % (ref 0.0–0.2)

## 2020-12-28 MED ORDER — AMLODIPINE BESYLATE 5 MG PO TABS
5.0000 mg | ORAL_TABLET | Freq: Once | ORAL | Status: AC
  Administered 2020-12-28: 5 mg via ORAL
  Filled 2020-12-28: qty 1

## 2020-12-28 MED ORDER — AMLODIPINE BESYLATE 5 MG PO TABS: 5.0000 mg | ORAL_TABLET | Freq: Every day | ORAL | 0 refills | Status: DC

## 2020-12-28 NOTE — ED Triage Notes (Signed)
Pt reports chest discomfort that started this week. Pt states he has been having palpitations x 1 year. Pt reports shob at times.

## 2020-12-28 NOTE — ED Provider Notes (Signed)
[facility] Provider Note: Lowella Dell, MD, FACEP  CSN: [id number] MRN: [id number] ARRIVAL: 12/28/20 at 0229 ROOM: MH02/MH02   CHIEF COMPLAINT  Palpitations   HISTORY OF PRESENT ILLNESS  12/28/20 2:45 AM Brandon Cole is a 35 y.o. male who has been having palpitations for about a year.  Palpitations are described as intermittent sensations of discomfort in his chest that are fleeting.  He describes the discomfort as a 3 out of 10.  He denies frank pain.  He does not get short of breath in association with palpitations but sometimes gets short of breath when lying supine at night.  Nothing seems to make his symptoms better or worse.  On arrival in the patient's room it is noted he has normal sinus rhythm with occasional PACs.  The PACs correlate with his episodes of discomfort.  He admits his blood pressure tends to run high and he is not currently on any antihypertensives.  He is in the process of trying to find a PCP.   Past Medical History:  Diagnosis Date  . Hypertension     History reviewed. No pertinent surgical history.  Family History  Problem Relation Age of Onset  . Hypertension Other   . Diabetes Other     Social History   Tobacco Use  . Smoking status: Never Smoker  . Smokeless tobacco: Never Used  Vaping Use  . Vaping Use: Never used  Substance Use Topics  . Alcohol use: Not Currently    Comment: occ  . Drug use: No    Prior to Admission medications   Medication Sig Start Date End Date Taking? Authorizing Provider  amLODipine (NORVASC) 5 MG tablet Take 1 tablet (5 mg total) by mouth daily. 12/28/20  Yes Kaneesha Constantino, MD    Allergies Patient has no known allergies.   REVIEW OF SYSTEMS  Negative except as noted here or in the History of Present Illness.   PHYSICAL EXAMINATION  Initial Vital Signs Blood pressure (!) 183/103, pulse 82, temperature 97.9 F (36.6 C), temperature source Oral, resp. rate 18, height 5\' 7"  (1.702 m),  weight 109.8 kg, SpO2 99 %.  Examination General: Well-developed, well-nourished male in no acute distress; appearance consistent with age of record HENT: normocephalic; atraumatic Eyes: pupils equal, round and reactive to light; extraocular muscles intact Neck: supple Heart: regular rate and rhythm with frequent PACs Lungs: clear to auscultation bilaterally Abdomen: soft; nondistended; nontender; bowel sounds present Extremities: No deformity; full range of motion; pulses normal Neurologic: Awake, alert and oriented; motor function intact in all extremities and symmetric; no facial droop Skin: Warm and dry Psychiatric: Normal mood and affect   RESULTS  Summary of this visit's results, reviewed and interpreted by myself:   EKG Interpretation  Date/Time:  Saturday Dec 28 2020 02:35:55 EDT Ventricular Rate:  82 PR Interval:  161 QRS Duration: 87 QT Interval:  355 QTC Calculation: 415 R Axis:   34 Text Interpretation: Sinus rhythm Normal ECG Rate is slower Confirmed by Bricelyn Freestone (05-30-1985) on 12/28/2020 2:45:03 AM      Laboratory Studies: Results for orders placed or performed during the hospital encounter of 12/28/20 (from the past 24 hour(s))  CBC with Differential     Status: None   Collection Time: 12/28/20  2:46 AM  Result Value Ref Range   WBC 7.8 4.0 - 10.5 K/uL   RBC 4.93 4.22 - 5.81 MIL/uL   Hemoglobin 15.1 13.0 - 17.0 g/dL   HCT 12/30/20 33.0 - 07.6 %  MCV 90.5 80.0 - 100.0 fL   MCH 30.6 26.0 - 34.0 pg   MCHC 33.9 30.0 - 36.0 g/dL   RDW 37.1 69.6 - 78.9 %   Platelets 308 150 - 400 K/uL   nRBC 0.0 0.0 - 0.2 %   Neutrophils Relative % 53 %   Neutro Abs 4.1 1.7 - 7.7 K/uL   Lymphocytes Relative 37 %   Lymphs Abs 2.9 0.7 - 4.0 K/uL   Monocytes Relative 8 %   Monocytes Absolute 0.6 0.1 - 1.0 K/uL   Eosinophils Relative 1 %   Eosinophils Absolute 0.1 0.0 - 0.5 K/uL   Basophils Relative 1 %   Basophils Absolute 0.1 0.0 - 0.1 K/uL   Immature Granulocytes 0 %   Abs  Immature Granulocytes 0.03 0.00 - 0.07 K/uL  Basic metabolic panel     Status: None   Collection Time: 12/28/20  2:46 AM  Result Value Ref Range   Sodium 139 135 - 145 mmol/L   Potassium 3.5 3.5 - 5.1 mmol/L   Chloride 105 98 - 111 mmol/L   CO2 24 22 - 32 mmol/L   Glucose, Bld 86 70 - 99 mg/dL   BUN 16 6 - 20 mg/dL   Creatinine, Ser 3.81 0.61 - 1.24 mg/dL   Calcium 9.5 8.9 - 01.7 mg/dL   GFR, Estimated >51 >02 mL/min   Anion gap 10 5 - 15   Imaging Studies: No results found.  ED COURSE and MDM  Nursing notes, initial and subsequent vitals signs, including pulse oximetry, reviewed and interpreted by myself.  Vitals:   12/28/20 0235  BP: (!) 183/103  Pulse: 82  Resp: 18  Temp: 97.9 F (36.6 C)  TempSrc: Oral  SpO2: 99%  Weight: 109.8 kg  Height: 5\' 7"  (1.702 m)   Medications  amLODipine (NORVASC) tablet 5 mg (5 mg Oral Given 12/28/20 0304)   We will start the patient on Norvasc and refer to cardiology for further work-up of his premature atrial contractions as well as definitive control of his hypertension.  PROCEDURES  Procedures   ED DIAGNOSES     ICD-10-CM   1. Premature atrial contractions  I49.1   2. Hypertension not at goal  [facility], [facility], MD 12/28/20 [phone]

## 2021-05-21 ENCOUNTER — Emergency Department (HOSPITAL_COMMUNITY)
Admission: EM | Admit: 2021-05-21 | Discharge: 2021-05-21 | Disposition: A | Discharge status: Home / Self Care | Payer: Self-pay | Attending: Emergency Medicine | Admitting: Emergency Medicine

## 2021-05-21 ENCOUNTER — Encounter (HOSPITAL_COMMUNITY): Payer: Self-pay

## 2021-05-21 ENCOUNTER — Other Ambulatory Visit: Payer: Self-pay

## 2021-05-21 DIAGNOSIS — I10 Essential (primary) hypertension: Secondary | ICD-10-CM | POA: Insufficient documentation

## 2021-05-21 DIAGNOSIS — Z79899 Other long term (current) drug therapy: Secondary | ICD-10-CM | POA: Insufficient documentation

## 2021-05-21 DIAGNOSIS — B084 Enteroviral vesicular stomatitis with exanthem: Secondary | ICD-10-CM | POA: Insufficient documentation

## 2021-05-21 MED ORDER — HYDROXYZINE HCL 25 MG PO TABS: 25.0000 mg | ORAL_TABLET | Freq: Four times a day (QID) | ORAL | 0 refills | Status: DC | PRN

## 2021-05-21 NOTE — Discharge Instructions (Signed)
Please read and follow all provided instructions.  Your diagnoses today include:  1. Hand, foot and mouth disease    You appear to have an upper respiratory infection (URI). An upper respiratory tract infection, or cold, is a viral infection of the air passages leading to the lungs. It should improve gradually after 5-7 days. You may have a lingering cough that lasts for 2- 4 weeks after the infection.  Tests performed today include: Vital signs. See below for your results today.   Medications prescribed:  Hydroxyzine - antihistamine  You can find this medication over-the-counter.   This medication will make you drowsy. DO NOT drive or perform any activities that require you to be awake and alert if taking this.  Take any prescribed medications only as directed. Treatment for your infection is aimed at treating the symptoms. There are no medications, such as antibiotics, that will cure your infection.   Home care instructions:  You can take Tylenol and/or Ibuprofen as directed on the packaging for fever reduction and pain relief.    For cough: honey 1/2 to 1 teaspoon (you can dilute the honey in water or another fluid).  You can also use guaifenesin and dextromethorphan for cough. You can use a humidifier for chest congestion and cough.  If you don't have a humidifier, you can sit in the bathroom with the hot shower running.      For sore throat: try warm salt water gargles, cepacol lozenges, throat spray, warm tea or water with lemon/honey, popsicles or ice, or OTC cold relief medicine for throat discomfort.    For congestion: take a daily anti-histamine like Zyrtec, Claritin, and a oral decongestant, such as pseudoephedrine.  You can also use Flonase 1-2 sprays in each nostril daily.    It is important to stay hydrated: drink plenty of fluids (water, gatorade/powerade/pedialyte, juices, or teas) to keep your throat moisturized and help further relieve irritation/discomfort.   Your  illness is contagious and can be spread to others, especially during the first 3 or 4 days. It cannot be cured by antibiotics or other medicines. Take basic precautions such as washing your hands often, covering your mouth when you cough or sneeze, and avoiding public places where you could spread your illness to others.   Please continue drinking plenty of fluids.  Use over-the-counter medicines as needed as directed on packaging for symptom relief.  You may also use ibuprofen or tylenol as directed on packaging for pain or fever.  Do not take multiple medicines containing Tylenol or acetaminophen to avoid taking too much of this medication.  Follow-up instructions: Please follow-up with your primary care provider in the next 3 days for further evaluation of your symptoms if you are not feeling better.   Return instructions:  Please return to the Emergency Department if you experience worsening symptoms.  RETURN IMMEDIATELY IF you develop shortness of breath, confusion or altered mental status, a new rash, become dizzy, faint, or poorly responsive, or are unable to be cared for at home. Please return if you have persistent vomiting and cannot keep down fluids or develop a fever that is not controlled by tylenol or motrin.   Please return if you have any other emergent concerns.  Additional Information:  Your vital signs today were: Pulse 83   Temp 98.5 F (36.9 C) (Oral)   Ht 5\' 7"  (1.702 m)   Wt 108.9 kg   SpO2 98%   BMI 37.59 kg/m  If your blood pressure (BP)  was elevated above 135/85 this visit, please have this repeated by your doctor within one month. --------------

## 2021-05-21 NOTE — ED Provider Notes (Signed)
[facility]-EMERGENCY DEPT Provider Note   CSN: [id number] Arrival date & time: 05/21/21  2154     History No chief complaint on file.   Brandon Cole is a 35 y.o. male.  Patient presents to the emergency department for suspected hand-foot-and-mouth disease.  Patient states that over the past 2 days he developed a sore throat, fever, and now an itchy rash on his hands.  He also feels a couple spots on his feet that are "coming up".  He states that he was exposed to a coworker who he found out tonight had hand-foot-and-mouth disease.  No difficulty breathing or shortness of breath.  No abdominal pain, vomiting or diarrhea.  He denies any risky sexual contacts.  No confusion, neck stiffness or neck pain.  No tick bites.  He has been taking over-the-counter medications.        Past Medical History:  Diagnosis Date   Hypertension     There are no problems to display for this patient.   No past surgical history on file.     Family History  Problem Relation Age of Onset   Hypertension Other    Diabetes Other     Social History   Tobacco Use   Smoking status: Never   Smokeless tobacco: Never  Vaping Use   Vaping Use: Never used  Substance Use Topics   Alcohol use: Not Currently    Comment: occ   Drug use: No    Home Medications Prior to Admission medications   Medication Sig Start Date End Date Taking? Authorizing Provider  hydrOXYzine (ATARAX/VISTARIL) 25 MG tablet Take 1 tablet (25 mg total) by mouth every 6 (six) hours as needed for itching. 05/21/21  Yes Renne Crigler, PA-C  amLODipine (NORVASC) 5 MG tablet Take 1 tablet (5 mg total) by mouth daily. 12/28/20   Molpus, Jonny Ruiz, MD    Allergies    Patient has no known allergies.  Review of Systems   Review of Systems  Constitutional:  Positive for fatigue and fever. Negative for chills.  HENT:  Positive for sore throat (Improving). Negative for congestion, ear pain, rhinorrhea and sinus  pressure.   Eyes:  Negative for redness.  Respiratory:  Negative for cough, shortness of breath and wheezing.   Gastrointestinal:  Negative for abdominal pain, diarrhea, nausea and vomiting.  Genitourinary:  Negative for dysuria.  Musculoskeletal:  Negative for myalgias and neck stiffness.  Skin:  Positive for rash.  Neurological:  Negative for headaches.  Hematological:  Negative for adenopathy.   Physical Exam Updated Vital Signs Pulse 83   Temp 98.5 F (36.9 C) (Oral)   Ht 5\' 7"  (1.702 m)   Wt 108.9 kg   SpO2 98%   BMI 37.59 kg/m   Physical Exam Vitals and nursing note reviewed.  Constitutional:      Appearance: He is well-developed.  HENT:     Head: Normocephalic and atraumatic.     Jaw: No trismus.     Right Ear: Tympanic membrane, ear canal and external ear normal.     Left Ear: Tympanic membrane, ear canal and external ear normal.     Nose: Nose normal. No mucosal edema or rhinorrhea.     Mouth/Throat:     Mouth: Mucous membranes are not dry.     Pharynx: Uvula midline. Posterior oropharyngeal erythema present. No oropharyngeal exudate or uvula swelling.     Tonsils: No tonsillar abscesses.     Comments: Tonsils are erythematous without exudate.  1  or 2 small lesions noted on the hard palate. Eyes:     General:        Right eye: No discharge.        Left eye: No discharge.     Conjunctiva/sclera: Conjunctivae normal.  Cardiovascular:     Rate and Rhythm: Normal rate and regular rhythm.     Heart sounds: Normal heart sounds.  Pulmonary:     Effort: Pulmonary effort is normal. No respiratory distress.     Breath sounds: Normal breath sounds. No wheezing or rales.  Abdominal:     Palpations: Abdomen is soft.     Tenderness: There is no abdominal tenderness.  Musculoskeletal:     Cervical back: Normal range of motion and neck supple.  Skin:    General: Skin is warm and dry.     Comments: Patient with macular rash of hands bilaterally on palms.  Does not  involve wrists or arms.  I looked at his feet and he can feel a lesion on the right foot but I do not see any definite lesions.  Neurological:     Mental Status: He is alert.    ED Results / Procedures / Treatments   Labs (all labs ordered are listed, but only abnormal results are displayed) Labs Reviewed - No data to display  EKG None  Radiology No results found.  Procedures Procedures   Medications Ordered in ED Medications - No data to display  ED Course  I have reviewed the triage vital signs and the nursing notes.  Pertinent labs & imaging results that were available during my care of the patient were reviewed by me and considered in my medical decision making (see chart for details).  Patient seen and examined.  Patient looks very well.  Given his sick contact, this is likely coxsackievirus.  Lower concern for secondary syphilis, tickborne illness, meningitis.  We will treat conservatively.  Patient's main complaint is itching of his hands from the rash that is present.  I have prescribed hydroxyzine for this.  Encouraged return to the emergency department with worsening.  He is willing to rest at home from work and take over-the-counter medications and treat supportively at this point.  Vital signs reviewed and are as follows: Pulse 83   Temp 98.5 F (36.9 C) (Oral)   Ht 5\' 7"  (1.702 m)   Wt 108.9 kg   SpO2 98%   BMI 37.59 kg/m     MDM Rules/Calculators/A&P                           Well-appearing patient with suspected hand-foot-and-mouth disease.    Final Clinical Impression(s) / ED Diagnoses Final diagnoses:  Hand, foot and mouth disease    Rx / DC Orders ED Discharge Orders          Ordered    hydrOXYzine (ATARAX/VISTARIL) 25 MG tablet  Every 6 hours PRN        05/21/21 2241             07/21/21, PA-C 05/21/21 2246    07/21/21, MD 05/23/21 1159

## 2021-05-21 NOTE — ED Triage Notes (Signed)
Pt states that he was exposed to hand, foot, and mouth disease on Saturday. Pt states that he got blisters on his hands on Monday.

## 2021-12-07 ENCOUNTER — Emergency Department (HOSPITAL_BASED_OUTPATIENT_CLINIC_OR_DEPARTMENT_OTHER)
Admission: EM | Admit: 2021-12-07 | Discharge: 2021-12-07 | Disposition: A | Discharge status: Home / Self Care | Payer: Self-pay | Attending: Emergency Medicine | Admitting: Emergency Medicine

## 2021-12-07 ENCOUNTER — Emergency Department (HOSPITAL_BASED_OUTPATIENT_CLINIC_OR_DEPARTMENT_OTHER): Payer: Self-pay

## 2021-12-07 ENCOUNTER — Other Ambulatory Visit: Payer: Self-pay

## 2021-12-07 ENCOUNTER — Encounter (HOSPITAL_BASED_OUTPATIENT_CLINIC_OR_DEPARTMENT_OTHER): Payer: Self-pay

## 2021-12-07 DIAGNOSIS — Z79899 Other long term (current) drug therapy: Secondary | ICD-10-CM | POA: Insufficient documentation

## 2021-12-07 DIAGNOSIS — I10 Essential (primary) hypertension: Secondary | ICD-10-CM

## 2021-12-07 DIAGNOSIS — M109 Gout, unspecified: Secondary | ICD-10-CM

## 2021-12-07 LAB — BASIC METABOLIC PANEL
Anion gap: 12 (ref 5–15)
BUN: 15 mg/dL (ref 6–20)
CO2: 25 mmol/L (ref 22–32)
Calcium: 9.5 mg/dL (ref 8.9–10.3)
Chloride: 102 mmol/L (ref 98–111)
Creatinine, Ser: 1.11 mg/dL (ref 0.61–1.24)
GFR, Estimated: >60 mL/min (ref >60)
Glucose, Bld: 164 mg/dL — ABNORMAL HIGH (ref 70–99)
Potassium: 3.6 mmol/L (ref 3.5–5.1)
Sodium: 139 mmol/L (ref 135–145)

## 2021-12-07 MED ORDER — AMLODIPINE BESYLATE 5 MG PO TABS: 5.0000 mg | ORAL_TABLET | Freq: Every day | ORAL | 0 refills | Status: DC

## 2021-12-07 MED ORDER — COLCHICINE 0.6 MG PO TABS
0.6000 mg | ORAL_TABLET | Freq: Once | ORAL | Status: AC
  Administered 2021-12-07: 0.6 mg via ORAL
  Filled 2021-12-07 (×2): qty 1

## 2021-12-07 MED ORDER — COLCHICINE 0.6 MG PO TABS: 0.6000 mg | ORAL_TABLET | Freq: Every day | ORAL | 0 refills | Status: DC

## 2021-12-07 MED ORDER — COLCHICINE 0.6 MG PO TABS
1.2000 mg | ORAL_TABLET | Freq: Once | ORAL | Status: AC
  Administered 2021-12-07: 1.2 mg via ORAL
  Filled 2021-12-07: qty 2

## 2021-12-07 NOTE — ED Triage Notes (Signed)
POV, pt sts that earlier today right great toe began hurting, denies injury, alert and oriented, ambulatory to triage. Toe appears unremarkable. ?

## 2021-12-07 NOTE — ED Provider Notes (Signed)
?[place] EMERGENCY DEPT ?Provider Note ? ? ?CSN: [id number] ?Arrival date & time: 12/07/21  [id number] ? ?  ? ?History ? ?Chief Complaint  ?Patient presents with  ? Toe Pain  ? ? ?Brandon Cole is a 36 y.o. male. ? ?The history is provided by the patient.  ?Toe Pain ?Brandon Cole is a 36 y.o. male who presents to the Emergency Department complaining of toe pain.  He presents to the ED for evaluation of right great toe pain that started two days ago.  No reports of trauma.  Pain is constant, throbbing, worse with ambulation.  No fever, cp, sob.  No recent tattoos, piercings or dental procedures.  No prior similar sxs.  Has a hx/o HTN but does not take medications.   ? ?  ? ?Home Medications ?Prior to Admission medications   ?Medication Sig Start Date End Date Taking? Authorizing Provider  ?amLODipine (NORVASC) 5 MG tablet Take 1 tablet (5 mg total) by mouth daily. 12/07/21  Yes Quintella Reichert, MD  ?colchicine 0.6 MG tablet Take 1 tablet (0.6 mg total) by mouth daily. 12/07/21  Yes Quintella Reichert, MD  ?hydrOXYzine (ATARAX/VISTARIL) 25 MG tablet Take 1 tablet (25 mg total) by mouth every 6 (six) hours as needed for itching. 05/21/21   Carlisle Cater, PA-C  ?   ? ?Allergies    ?Patient has no known allergies.   ? ?Review of Systems   ?Review of Systems  ?All other systems reviewed and are negative. ? ?Physical Exam ?Updated Vital Signs ?BP (!) 167/94   Pulse 78   Temp 97.6 ?F (36.4 ?C) (Oral)   Resp 20   Ht 5\' 7"  (1.702 m)   Wt 111.1 kg   SpO2 93%   BMI 38.37 kg/m?  ?Physical Exam ?Vitals and nursing note reviewed.  ?Constitutional:   ?   Appearance: He is well-developed.  ?HENT:  ?   Head: Normocephalic and atraumatic.  ?Cardiovascular:  ?   Rate and Rhythm: Normal rate and regular rhythm.  ?Pulmonary:  ?   Effort: Pulmonary effort is normal. No respiratory distress.  ?Musculoskeletal:  ?   Comments: 2+ DP pulses bilaterally.  There is soft tissue swelling erythema and tenderness to the first MTP  joint.  He is able to wiggle the toe without difficulty.    ?Skin: ?   General: Skin is warm and dry.  ?Neurological:  ?   Mental Status: He is alert and oriented to person, place, and time.  ?Psychiatric:     ?   Behavior: Behavior normal.  ? ? ?ED Results / Procedures / Treatments   ?Labs ?(all labs ordered are listed, but only abnormal results are displayed) ?Labs Reviewed  ?BASIC METABOLIC PANEL - Abnormal; Notable for the following components:  ?    Result Value  ? Glucose, Bld 164 (*)   ? All other components within normal limits  ? ? ?EKG ?None ? ?Radiology ?DG Foot Complete Right ? ?Result Date: 12/07/2021 ?CLINICAL DATA:  Atraumatic right great toe pain EXAM: RIGHT FOOT COMPLETE - 3+ VIEW COMPARISON:  None. FINDINGS: There is no evidence of fracture or dislocation. There is no evidence of arthropathy or other focal bone abnormality. Soft tissues are unremarkable. IMPRESSION: Negative. Electronically Signed   By: Jorje Guild M.D.   On: 12/07/2021 04:49   ? ?Procedures ?Procedures  ? ? ?Medications Ordered in ED ?Medications  ?colchicine tablet 1.2 mg (1.2 mg Oral Given 12/07/21 0421)  ?colchicine tablet 0.6 mg (0.6 mg Oral  Given 12/07/21 0521)  ? ? ?ED Course/ Medical Decision Making/ A&P ?  ?                        ?Medical Decision Making ?Amount and/or Complexity of Data Reviewed ?Labs: ordered. ?Radiology: ordered. ? ?Risk ?Prescription drug management. ? ? ?Pt with hx/o HTN not on medications here for evaluation of right great toe pain for two days, no trauma.  He has erythema, swelling and tenderness.  No wounds to the skin. Imaging neg for acute fracture or lytic lesion, images personally reviewed.  Suspect acute gout.  Current clinical picture is not c/w septic arthritis.  He was treated in the ED with colchicine with improvement in his sxs.  BMP obtained as pt without recent labs, untreated HTN - no acute kidney injury.  Glu is elevated but sample is not fasting.  In terms of HTN no evidence of  hypertensive urgency.  Will start amlodipine with pcp follow up.  Also discussed elevated glucose - will need pcp follow up for further eval.   ? ? ? ? ? ? ? ?Final Clinical Impression(s) / ED Diagnoses ?Final diagnoses:  ?Acute gout involving toe of right foot, unspecified cause  ?Essential hypertension  ? ? ?Rx / DC Orders ?ED Discharge Orders   ? ?      Ordered  ?  amLODipine (NORVASC) 5 MG tablet  Daily       ? 12/07/21 0553  ?  colchicine 0.6 MG tablet  Daily       ? 12/07/21 0553  ? ?  ?  ? ?  ? ? ?  ?Quintella Reichert, MD ?12/07/21 [phone] ? ?

## 2022-05-14 ENCOUNTER — Ambulatory Visit
Admission: EM | Admit: 2022-05-14 | Discharge: 2022-05-14 | Disposition: A | Discharge status: Home / Self Care | Payer: Self-pay | Attending: Family Medicine | Admitting: Family Medicine

## 2022-05-14 ENCOUNTER — Ambulatory Visit (INDEPENDENT_AMBULATORY_CARE_PROVIDER_SITE_OTHER): Payer: Self-pay

## 2022-05-14 ENCOUNTER — Encounter: Payer: Self-pay | Admitting: Emergency Medicine

## 2022-05-14 DIAGNOSIS — S39012A Strain of muscle, fascia and tendon of lower back, initial encounter: Secondary | ICD-10-CM | POA: Insufficient documentation

## 2022-05-14 DIAGNOSIS — I160 Hypertensive urgency: Secondary | ICD-10-CM | POA: Insufficient documentation

## 2022-05-14 LAB — CBC WITH DIFFERENTIAL/PLATELET
Abs Immature Granulocytes: 0.02 10*3/uL (ref 0.00–0.07)
Basophils Absolute: 0 10*3/uL (ref 0.0–0.1)
Basophils Relative: 1 %
Eosinophils Absolute: 0.1 10*3/uL (ref 0.0–0.5)
Eosinophils Relative: 1 %
HCT: 43.6 % (ref 39.0–52.0)
Hemoglobin: 14.9 g/dL (ref 13.0–17.0)
Immature Granulocytes: 0 %
Lymphocytes Relative: 23 %
Lymphs Abs: 2 10*3/uL (ref 0.7–4.0)
MCH: 30.3 pg (ref 26.0–34.0)
MCHC: 34.2 g/dL (ref 30.0–36.0)
MCV: 88.8 fL (ref 80.0–100.0)
Monocytes Absolute: 0.7 10*3/uL (ref 0.1–1.0)
Monocytes Relative: 8 %
Neutro Abs: 6 10*3/uL (ref 1.7–7.7)
Neutrophils Relative %: 67 %
Platelets: 275 10*3/uL (ref 150–400)
RBC: 4.91 MIL/uL (ref 4.22–5.81)
RDW: 12.8 % (ref 11.5–15.5)
WBC: 8.9 10*3/uL (ref 4.0–10.5)
nRBC: 0 % (ref 0.0–0.2)

## 2022-05-14 LAB — BASIC METABOLIC PANEL
Anion gap: 9 (ref 5–15)
BUN: 12 mg/dL (ref 6–20)
CO2: 24 mmol/L (ref 22–32)
Calcium: 9.2 mg/dL (ref 8.9–10.3)
Chloride: 105 mmol/L (ref 98–111)
Creatinine, Ser: 1.05 mg/dL (ref 0.61–1.24)
GFR, Estimated: >60 mL/min (ref >60)
Glucose, Bld: 97 mg/dL (ref 70–99)
Potassium: 3.6 mmol/L (ref 3.5–5.1)
Sodium: 138 mmol/L (ref 135–145)

## 2022-05-14 MED ORDER — KETOROLAC TROMETHAMINE 60 MG/2ML IM SOLN
30.0000 mg | Freq: Once | INTRAMUSCULAR | Status: AC
  Administered 2022-05-14: 30 mg via INTRAMUSCULAR

## 2022-05-14 MED ORDER — CLONIDINE HCL 0.2 MG PO TABS
0.2000 mg | ORAL_TABLET | Freq: Once | ORAL | Status: AC
  Administered 2022-05-14: 0.2 mg via ORAL

## 2022-05-14 MED ORDER — METHOCARBAMOL 500 MG PO TABS: 500.0000 mg | ORAL_TABLET | Freq: Two times a day (BID) | ORAL | 0 refills | Status: DC

## 2022-05-14 MED ORDER — NAPROXEN 500 MG PO TABS: 500.0000 mg | ORAL_TABLET | Freq: Two times a day (BID) | ORAL | 0 refills | Status: DC

## 2022-05-14 MED ORDER — AMLODIPINE BESYLATE 10 MG PO TABS: 10.0000 mg | ORAL_TABLET | Freq: Every day | ORAL | 2 refills | Status: DC

## 2022-05-14 MED ORDER — HYDROCHLOROTHIAZIDE 25 MG PO TABS: 25.0000 mg | ORAL_TABLET | Freq: Every day | ORAL | 2 refills | Status: DC

## 2022-05-14 NOTE — ED Provider Notes (Signed)
[facility]    CSN: [id number] Arrival date & time: 05/14/22  1838      History   Chief Complaint Chief Complaint  Patient presents with   Back Pain    HPI  HPI Brandon Cole is a 36 y.o. male.   Brandon Cole presents for moderately severe low back pain that started 3 days ago without known injury.  Patient was walking into work and then had a sharp pain in his right lower back. Pain radiates to his right thigh but not to his knee. Endorses leg weakness (though now resolved) and leg pain.  Denies weakness, numbness, tingling, dysuria, abdominal pain, chest pain, incontinence, pelvic pain, perianal numbness.   Continues to have sharp spasms with movement. Brandon Cole yesterday felt like his legs were weak but today not so much.  He has never had any pain like this before.  Denies previous or current injury.  He took some Tylenol with some relief.  He is unsure whether the Vicodin that his friend gave him or a muscle relaxer that someone else gave him helped.   Fever : no Perianal numbness: no Bowel incontinence: no Bladder incontinence: no Trauma: no  Sore throat: no   Cough: no Nasal congestion.  Appetite: normal  Hydration: normal  Abdominal pain: no Nausea: no Vomiting: no Dysuria: no  Hematuria: no Sleep disturbance: yes  Neck Pain: no Headache: no     Past Medical History:  Diagnosis Date   Hypertension     There are no problems to display for this patient.   History reviewed. No pertinent surgical history.     Home Medications    Prior to Admission medications   Medication Sig Start Date End Date Taking? Authorizing Provider  hydrochlorothiazide (HYDRODIURIL) 25 MG tablet Take 1 tablet (25 mg total) by mouth daily. 05/14/22 08/12/22 Yes Rikia Sukhu, Seward Meth, DO  methocarbamol (ROBAXIN) 500 MG tablet Take 1 tablet (500 mg total) by mouth 2 (two) times daily. 05/14/22  Yes Esbeydi Manago, DO  naproxen (NAPROSYN) 500 MG tablet Take 1 tablet (500 mg  total) by mouth 2 (two) times daily with a meal. 05/14/22  Yes Krina Mraz, DO  amLODipine (NORVASC) 10 MG tablet Take 1 tablet (10 mg total) by mouth daily. 05/14/22   Katha Cabal, DO  colchicine 0.6 MG tablet Take 1 tablet (0.6 mg total) by mouth daily. 12/07/21   Tilden Fossa, MD  hydrOXYzine (ATARAX/VISTARIL) 25 MG tablet Take 1 tablet (25 mg total) by mouth every 6 (six) hours as needed for itching. 05/21/21   Renne Crigler, PA-C    Family History Family History  Problem Relation Age of Onset   Hypertension Other    Diabetes Other     Social History Social History   Tobacco Use   Smoking status: Never   Smokeless tobacco: Never  Vaping Use   Vaping Use: Never used  Substance Use Topics   Alcohol use: Yes    Comment: occ   Drug use: No     Allergies   Patient has no known allergies.   Review of Systems Review of Systems: egative unless otherwise stated in HPI.      Physical Exam Triage Vital Signs ED Triage Vitals  Enc Vitals Group     BP 05/14/22 1849 (!) 199/135     Pulse Rate 05/14/22 1849 (!) 113     Resp 05/14/22 1849 16     Temp 05/14/22 1849 98.8 F (37.1 C)     Temp Source 05/14/22  1849 Oral     SpO2 05/14/22 1849 94 %     Weight --      Height --      Head Circumference --      Peak Flow --      Pain Score 05/14/22 1848 8     Pain Loc --      Pain Edu? --      Excl. in [place]? --    No data found.  Updated Vital Signs BP (!) 190/101 (BP Location: Right Arm)   Pulse (!) 113   Temp 98.8 F (37.1 C) (Oral)   Resp 16   SpO2 94%   Visual Acuity Right Eye Distance:   Left Eye Distance:   Bilateral Distance:    Right Eye Near:   Left Eye Near:    Bilateral Near:     Physical Exam GEN: well appearing male in no acute distress  CVS: well perfused, regular rate and rhythm, no murmurs, rubs or gallops, no JVP RESP: speaking in full sentences without pause, no respiratory distress, clear to auscultation bilaterally MSK:  Spine: -  Inspection: no gross deformity or asymmetry, swelling or ecchymosis. No skin changes  - Palpation: No TTP over the spinous processes of C, T or L spine, bilateral lumbar paraspinal muscles (R>L), no SI joint tenderness bilaterally - ROM: limited flexion and extension of lumbar spine in flexion, active spasms with movement  - Strength:  5/5 strength of lower extremity in L4-S1 nerve root distributions b/l - Neuro: sensation intact in the L4-S1 nerve root distribution b/l SKIN: warm, dry, no overly skin rash or erythema    UC Treatments / Results  Labs (all labs ordered are listed, but only abnormal results are displayed) Labs Reviewed  BASIC METABOLIC PANEL  CBC WITH DIFFERENTIAL/PLATELET    EKG   Radiology DG Lumbar Spine Complete  Result Date: 05/14/2022 CLINICAL DATA:  Right-sided low back pain radiating down the left leg for 3 days. EXAM: LUMBAR SPINE - COMPLETE 4+ VIEW COMPARISON:  None Available. FINDINGS: There are 5 non rib-bearing lumbar vertebrae. There is trace retrolisthesis of L5 on S1. No fracture is identified. Intervertebral disc space heights are preserved aside from at most mild narrowing at L4-5 and L5-S1. IMPRESSION: No acute osseous abnormality.  At most minimal degenerative changes. Electronically Signed   By: Logan Bores M.D.   On: 05/14/2022 20:04    Procedures Procedures (including critical care time)  Medications Ordered in UC Medications  ketorolac (TORADOL) injection 30 mg (30 mg Intramuscular Given 05/14/22 2002)  cloNIDine (CATAPRES) tablet 0.2 mg (0.2 mg Oral Given 05/14/22 2028)    Initial Impression / Assessment and Plan / UC Course  I have reviewed the triage vital signs and the nursing notes.  Pertinent labs & imaging results that were available during my care of the patient were reviewed by me and considered in my medical decision making (see chart for details).      Pt is a 36 y.o.  male with 3 days of lower back pain.  Obtained thoracic and  lumbar plain films.  Xray personally reviewed by me were unremarkable for fracture, significant malalignment or dislocation.  Radiologist notes trace retrolisthesis of L5 on S1 and mild narrowing of the intravertebral disc brace L4-S1.  History and exam concerning for muscular strain.  Patient to gradually return to normal activities, as tolerated and continue ordinary activities within the limits permitted by pain. Prescribed Naproxen sodium   and muscle relaxer  for pain relief.  Advised patient to avoid other NSAIDs while taking  Naprosyn. Tylenol and Lidocaine patches PRN for multimodal pain relief. Counseled patient on red flag symptoms and when to seek immediate care.   No red flags suggesting cauda equina syndrome or progressive major motor weakness. Patient to follow up with orthopedic provider if symptoms do not improve with conservative treatment.   Patient has uncontrolled hypertension.  He is very hypertensive here 199/135 and initially tachycardic.  Given 0.2 mg clonidine and Toradol injection IM.  He was previously placed on amlodipine 5 mg, per chart review ,however he has not been taking this medication as he was " supposed to start working out."  He does not currently have a PCP.  States that he knows that he needs to change his eating habits and start exercising again.  BMP with stable serum creatinine and no electrolyte abnormalities.  Restart amlodipine but at 10 mg and start hydrochlorothiazide 25 mg.  Risk and benefits of these medications discussed with patient and he is agreeable to start them.  He is also open to finding a PCP.     Discussed MDM, treatment plan and plan for follow-up with patient/parent who agrees with plan.   Final Clinical Impressions(s) / UC Diagnoses   Final diagnoses:  Strain of lumbar region, initial encounter  Hypertensive urgency     Discharge Instructions      If medication was prescribed, stop by the pharmacy to pick up your prescriptions.  For  your  pain, Take 1500 mg Tylenol twice a day, take muscle relaxer (Robaxin) twice a day, take Naprosyn twice a day,  as needed for pain. Apply cold/heat compresses intermittently, as needed.  As pain recedes, begin normal activities slowly as tolerated.  Follow up with primary care provider or an orthopedic provider, if symptoms persist.  Watch for worsening symptoms such as an increasing weakness or loss of sensation, increasing pain and/or the loss of bladder or bowel function. Should any of these occur, go to the emergency department immediately.   Please monitor your blood pressure at home first thing in the morning prior to eating and drinking and journal this for PCP follow-up. If you remain persistently elevated after your symptoms improve, you may need additional medical management. Other recommendations for high blood pressure are to decrease the amount of salt in your diet, exercise (150 minutes of moderate intensity exercise weekly), weight loss.  It is important that you establish care with a primary care doctor.   We hope you feel better soon and it was our pleasure caring for you today.      ED Prescriptions     Medication Sig Dispense Auth. Provider   hydrochlorothiazide (HYDRODIURIL) 25 MG tablet Take 1 tablet (25 mg total) by mouth daily. 30 tablet Jaxton Casale, DO   amLODipine (NORVASC) 10 MG tablet Take 1 tablet (10 mg total) by mouth daily. 30 tablet Shanzay Hepworth, DO   methocarbamol (ROBAXIN) 500 MG tablet Take 1 tablet (500 mg total) by mouth 2 (two) times daily. 20 tablet Liel Rudden, DO   naproxen (NAPROSYN) 500 MG tablet Take 1 tablet (500 mg total) by mouth 2 (two) times daily with a meal. 30 tablet Upton Russey, DO      PDMP not reviewed this encounter.   Katha Cabal, DO 05/16/22 1753

## 2022-05-14 NOTE — ED Triage Notes (Signed)
Pt c/o right side lower back pain that radiates down his left leg x 3 days.

## 2022-05-14 NOTE — Discharge Instructions (Addendum)
If medication was prescribed, stop by the pharmacy to pick up your prescriptions.  For your  pain, Take 1500 mg Tylenol twice a day, take muscle relaxer (Robaxin) twice a day, take Naprosyn twice a day,  as needed for pain. Apply cold/heat compresses intermittently, as needed.  As pain recedes, begin normal activities slowly as tolerated.  Follow up with primary care provider or an orthopedic provider, if symptoms persist.  Watch for worsening symptoms such as an increasing weakness or loss of sensation, increasing pain and/or the loss of bladder or bowel function. Should any of these occur, go to the emergency department immediately.   Please monitor your blood pressure at home first thing in the morning prior to eating and drinking and journal this for PCP follow-up. If you remain persistently elevated after your symptoms improve, you may need additional medical management. Other recommendations for high blood pressure are to decrease the amount of salt in your diet, exercise (150 minutes of moderate intensity exercise weekly), weight loss.  It is important that you establish care with a primary care doctor.   We hope you feel better soon and it was our pleasure caring for you today.

## 2022-05-19 ENCOUNTER — Encounter: Payer: Self-pay | Admitting: Emergency Medicine

## 2023-09-30 IMAGING — DX DG FOOT COMPLETE 3+V*R*
3 series · 3 of 3 positions shown · non-contrast
Comparison: None.

CLINICAL DATA: Atraumatic right great toe pain

EXAM:
RIGHT FOOT COMPLETE - 3+ VIEW

[foot ap]
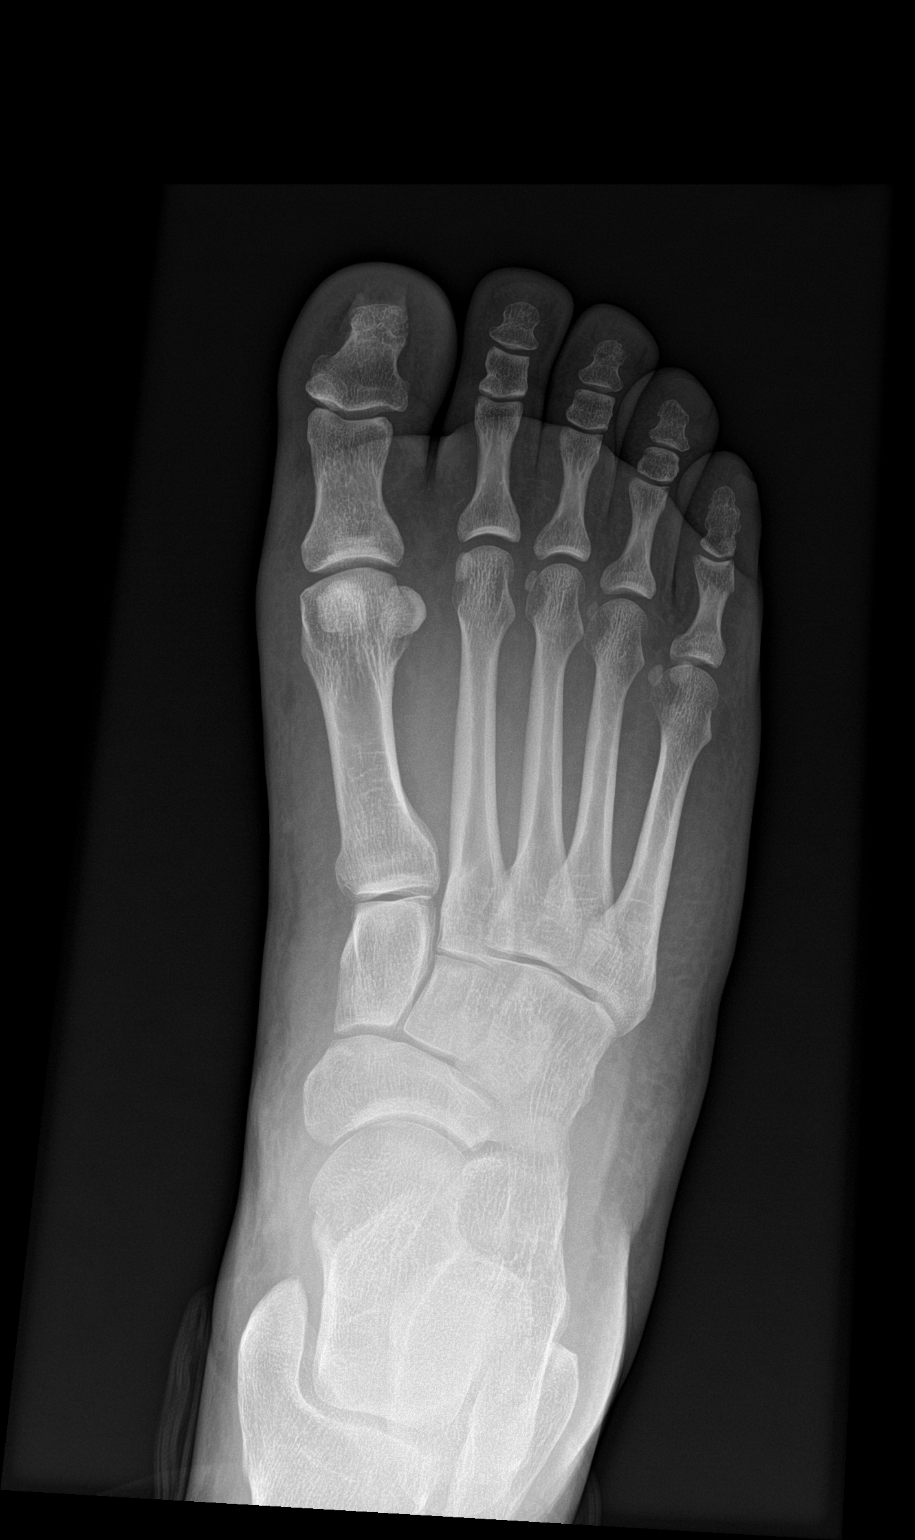

[foot obl]
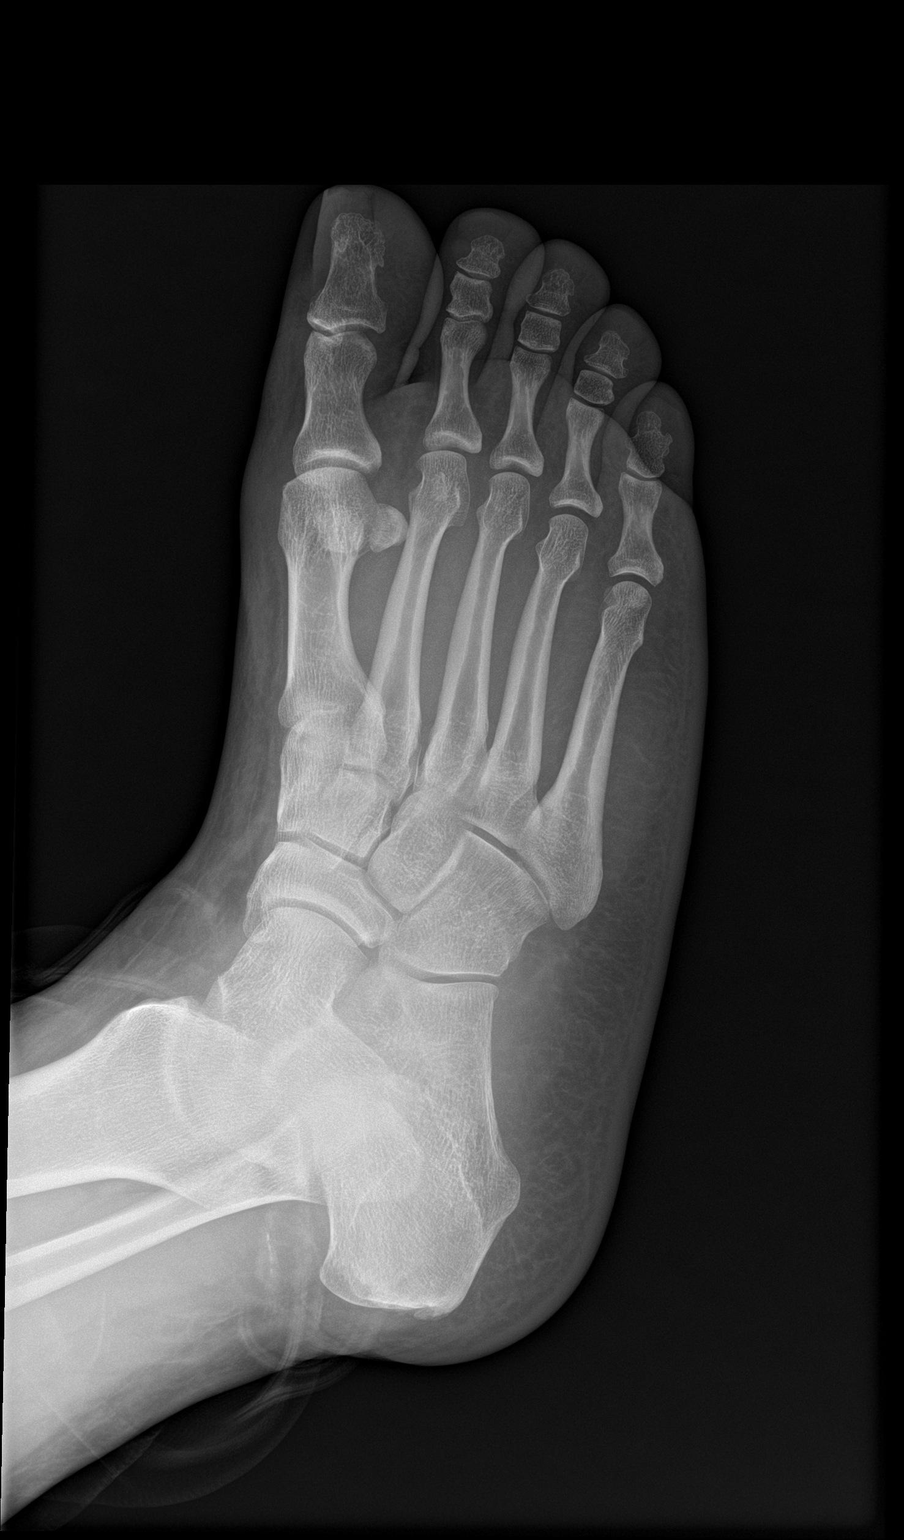

[foot lat]
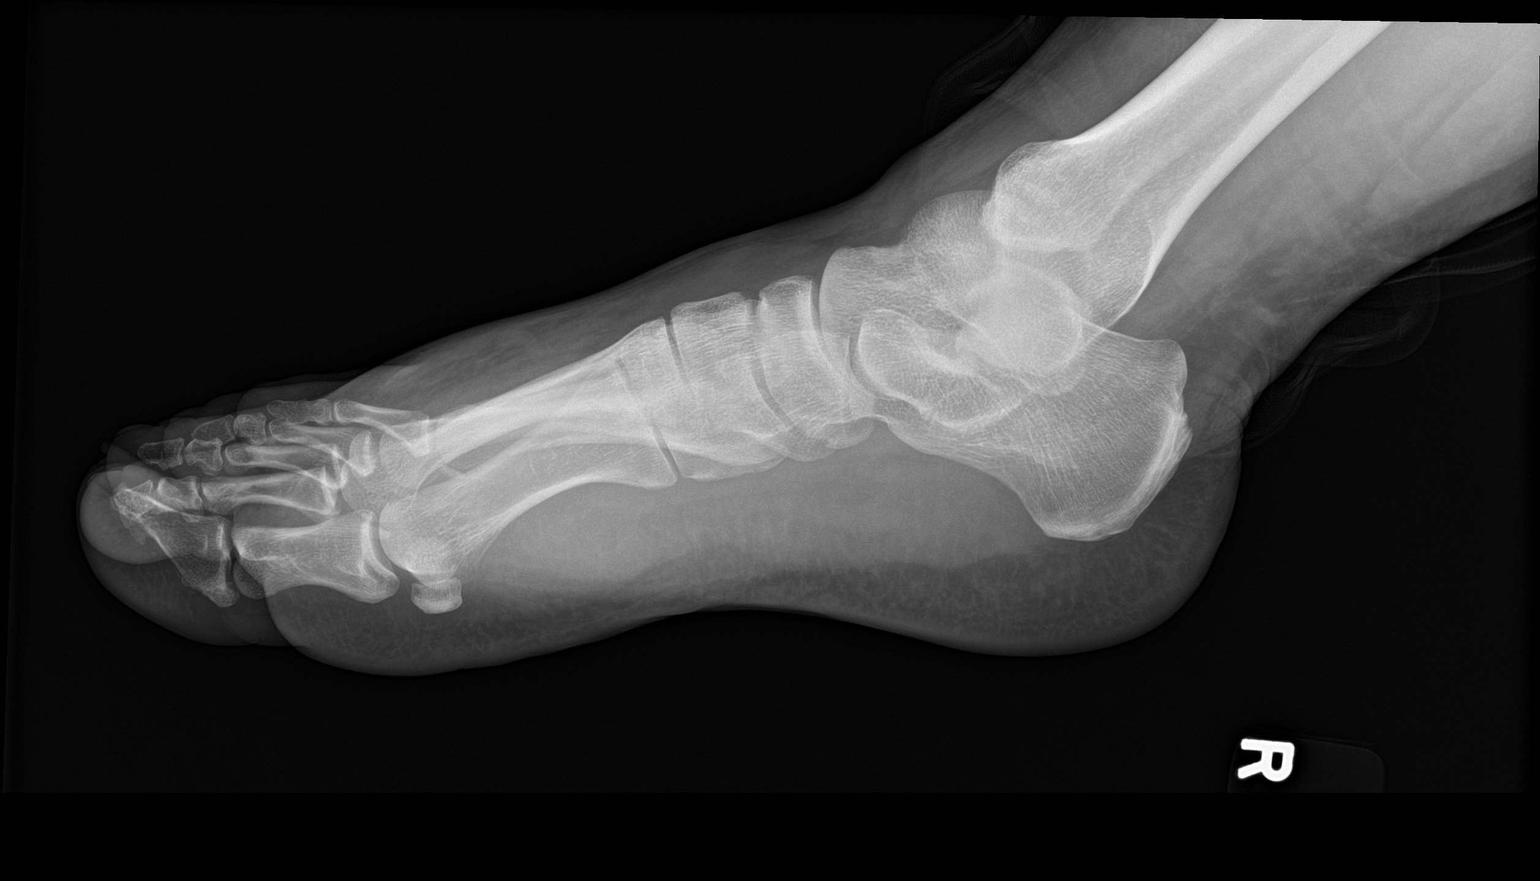

[3 of 3 positions shown; findings below may reference images not displayed]

FINDINGS: There is no evidence of fracture or dislocation. There is no
evidence of arthropathy or other focal bone abnormality. Soft
tissues are unremarkable.
IMPRESSION: Negative.

## 2023-12-20 ENCOUNTER — Ambulatory Visit
Admission: EM | Admit: 2023-12-20 | Discharge: 2023-12-20 | Disposition: A | Discharge status: Home / Self Care | Payer: Self-pay | Attending: Physician Assistant | Admitting: Physician Assistant

## 2023-12-20 ENCOUNTER — Encounter: Payer: Self-pay | Admitting: Emergency Medicine

## 2023-12-20 DIAGNOSIS — L739 Follicular disorder, unspecified: Secondary | ICD-10-CM

## 2023-12-20 DIAGNOSIS — R591 Generalized enlarged lymph nodes: Secondary | ICD-10-CM

## 2023-12-20 DIAGNOSIS — I10 Essential (primary) hypertension: Secondary | ICD-10-CM

## 2023-12-20 MED ORDER — DOXYCYCLINE HYCLATE 100 MG PO CAPS: 100.0000 mg | ORAL_CAPSULE | Freq: Two times a day (BID) | ORAL | 0 refills | Status: AC

## 2023-12-20 NOTE — ED Provider Notes (Signed)
 [facility]    CSN: [id number] Arrival date & time: 12/20/23  1506      History   Chief Complaint Chief Complaint  Patient presents with   Bumps    HPI Brandon Cole is a 38 y.o. male presenting for 3-day history of bumps along the hairline of the left forehead.  The bumps are little tender but he denies significant pain.  He also has swollen lymph node near the left ear but denies pain in the ear.  Patient has not noted any drainage from the bumps.  Denies headaches, fever or any other areas of rash.  Denies any change to hair products.  Has not been using any over-the-counter medications.  HPI  Past Medical History:  Diagnosis Date   Hypertension     There are no active problems to display for this patient.   History reviewed. No pertinent surgical history.     Home Medications    Prior to Admission medications   Medication Sig Start Date End Date Taking? Authorizing Provider  doxycycline  (VIBRAMYCIN ) 100 MG capsule Take 1 capsule (100 mg total) by mouth 2 (two) times daily for 7 days. 12/20/23 12/27/23 Yes Floydene Hy, PA-C  amLODipine  (NORVASC ) 10 MG tablet Take 1 tablet (10 mg total) by mouth daily. 05/14/22   Brimage, Vondra, DO  colchicine  0.6 MG tablet Take 1 tablet (0.6 mg total) by mouth daily. 12/07/21   Kelsey Patricia, MD  hydrochlorothiazide  (HYDRODIURIL ) 25 MG tablet Take 1 tablet (25 mg total) by mouth daily. 05/14/22 08/12/22  Brimage, Vondra, DO  hydrOXYzine  (ATARAX /VISTARIL ) 25 MG tablet Take 1 tablet (25 mg total) by mouth every 6 (six) hours as needed for itching. 05/21/21   Geiple, Joshua, PA-C  methocarbamol  (ROBAXIN ) 500 MG tablet Take 1 tablet (500 mg total) by mouth 2 (two) times daily. 05/14/22   Brimage, Vondra, DO  naproxen  (NAPROSYN ) 500 MG tablet Take 1 tablet (500 mg total) by mouth 2 (two) times daily with a meal. 05/14/22   Fidel Huddle, DO    Family History Family History  Problem Relation Age of Onset   Hypertension  Other    Diabetes Other     Social History Social History   Tobacco Use   Smoking status: Never   Smokeless tobacco: Never  Vaping Use   Vaping status: Never Used  Substance Use Topics   Alcohol use: Yes    Comment: occ   Drug use: No     Allergies   Patient has no known allergies.   Review of Systems Review of Systems  Constitutional:  Negative for fatigue and fever.  HENT:  Negative for congestion, ear pain, rhinorrhea and sore throat.   Respiratory:  Negative for cough.   Skin:  Positive for rash.  Neurological:  Negative for dizziness and headaches.  Hematological:  Positive for adenopathy.     Physical Exam Triage Vital Signs ED Triage Vitals  Encounter Vitals Group     BP 12/20/23 1539 (!) 169/139     Systolic BP Percentile --      Diastolic BP Percentile --      Pulse Rate 12/20/23 1539 (!) 117     Resp 12/20/23 1539 18     Temp 12/20/23 1539 98.8 F (37.1 C)     Temp Source 12/20/23 1539 Oral     SpO2 12/20/23 1539 97 %     Weight --      Height --      Head Circumference --  Peak Flow --      Pain Score 12/20/23 1536 0     Pain Loc --      Pain Education --      Exclude from Growth Chart --    No data found.  Updated Vital Signs BP (!) 169/139 (BP Location: Left Arm)   Pulse (!) 117   Temp 98.8 F (37.1 C) (Oral)   Resp 18   SpO2 97%      Physical Exam Vitals and nursing note reviewed.  Constitutional:      General: He is not in acute distress.    Appearance: Normal appearance. He is well-developed.  HENT:     Head: Normocephalic and atraumatic.     Right Ear: Tympanic membrane, ear canal and external ear normal.     Left Ear: Tympanic membrane, ear canal and external ear normal.     Ears:     Comments: 1 tender and enlarge left preauricular lymph node    Nose: Nose normal.     Mouth/Throat:     Mouth: Mucous membranes are moist.     Pharynx: Oropharynx is clear.  Eyes:     Conjunctiva/sclera: Conjunctivae normal.   Cardiovascular:     Rate and Rhythm: Regular rhythm. Tachycardia present.  Pulmonary:     Effort: Pulmonary effort is normal. No respiratory distress.     Breath sounds: Normal breath sounds.  Abdominal:     Palpations: Abdomen is soft.     Tenderness: There is no abdominal tenderness.  Musculoskeletal:        General: No swelling.     Cervical back: Neck supple.  Skin:    General: Skin is warm and dry.     Capillary Refill: Capillary refill takes less than 2 seconds.     Findings: Rash (multiple erythematous papules around hair follicles left frontal region) present.  Neurological:     Mental Status: He is alert.  Psychiatric:        Mood and Affect: Mood normal.      UC Treatments / Results  Labs (all labs ordered are listed, but only abnormal results are displayed) Labs Reviewed - No data to display  EKG   Radiology No results found.  Procedures Procedures (including critical care time)  Medications Ordered in UC Medications - No data to display  Initial Impression / Assessment and Plan / UC Course  I have reviewed the triage vital signs and the nursing notes.  Pertinent labs & imaging results that were available during my care of the patient were reviewed by me and considered in my medical decision making (see chart for details).   38 year old male presents for rash along hairline for the past couple days with swollen lymph node in the ear.  Denies headaches, fever.  Blood pressure elevated 169/139.  Patient on HCTZ.  Advised to continue meds, keep log of BP and if consistently greater than 140/90 to follow-up PCP.  Presentation most consistent with folliculitis.  We also discussed possibility of shingles but not clear case of shingles.  Will treat for suspected folliculitis at this time with doxycycline .  Also discussed supportive care at home.  Thoroughly reviewed return and ER precautions.   Final Clinical Impressions(s) / UC Diagnoses   Final diagnoses:   Folliculitis  Lymphadenopathy of head and neck  Essential hypertension     Discharge Instructions      - Mild skin infection.  Start antibiotics. - We discussed other possibility which could be developing  shingles if he notices a lot more blisters forming the area or on your face you have headaches really bad on the side please return for evaluation. - Blood pressure is high.  Continue taking your blood pressure medication and keep a log of blood pressure.  If consistently greater than 140/90 you need to see your PCP.    ED Prescriptions     Medication Sig Dispense Auth. Provider   doxycycline  (VIBRAMYCIN ) 100 MG capsule Take 1 capsule (100 mg total) by mouth 2 (two) times daily for 7 days. 14 capsule Floydene Hy, PA-C      PDMP not reviewed this encounter.   Floydene Hy, PA-C 12/20/23 1756

## 2023-12-20 NOTE — ED Triage Notes (Signed)
 Pt presents with a some bumps near the left side of his ear and forehead x 3 days.

## 2023-12-20 NOTE — Discharge Instructions (Addendum)
-   Mild skin infection.  Start antibiotics. - We discussed other possibility which could be developing shingles if he notices a lot more blisters forming the area or on your face you have headaches really bad on the side please return for evaluation. - Blood pressure is high.  Continue taking your blood pressure medication and keep a log of blood pressure.  If consistently greater than 140/90 you need to see your PCP.

## 2024-05-20 ENCOUNTER — Encounter (HOSPITAL_BASED_OUTPATIENT_CLINIC_OR_DEPARTMENT_OTHER): Payer: Self-pay | Admitting: Emergency Medicine

## 2024-05-20 ENCOUNTER — Emergency Department (HOSPITAL_BASED_OUTPATIENT_CLINIC_OR_DEPARTMENT_OTHER): Admitting: Radiology

## 2024-05-20 ENCOUNTER — Emergency Department (HOSPITAL_BASED_OUTPATIENT_CLINIC_OR_DEPARTMENT_OTHER)
Admission: EM | Admit: 2024-05-20 | Discharge: 2024-05-20 | Disposition: A | Discharge status: Home / Self Care | Attending: Emergency Medicine | Admitting: Emergency Medicine

## 2024-05-20 DIAGNOSIS — I10 Essential (primary) hypertension: Secondary | ICD-10-CM | POA: Diagnosis present

## 2024-05-20 DIAGNOSIS — Z79899 Other long term (current) drug therapy: Secondary | ICD-10-CM | POA: Diagnosis not present

## 2024-05-20 DIAGNOSIS — M7989 Other specified soft tissue disorders: Secondary | ICD-10-CM | POA: Diagnosis not present

## 2024-05-20 DIAGNOSIS — M79672 Pain in left foot: Secondary | ICD-10-CM | POA: Diagnosis not present

## 2024-05-20 DIAGNOSIS — M549 Dorsalgia, unspecified: Secondary | ICD-10-CM | POA: Diagnosis not present

## 2024-05-20 DIAGNOSIS — R109 Unspecified abdominal pain: Secondary | ICD-10-CM | POA: Diagnosis not present

## 2024-05-20 DIAGNOSIS — R600 Localized edema: Secondary | ICD-10-CM | POA: Insufficient documentation

## 2024-05-20 LAB — URINALYSIS, ROUTINE W REFLEX MICROSCOPIC
Bilirubin Urine: NEGATIVE
Glucose, UA: NEGATIVE mg/dL
Hgb urine dipstick: NEGATIVE
Ketones, ur: NEGATIVE mg/dL
Leukocytes,Ua: NEGATIVE
Nitrite: NEGATIVE
Protein, ur: NEGATIVE mg/dL
Specific Gravity, Urine: 1.021 (ref 1.005–1.030)
pH: 6 (ref 5.0–8.0)

## 2024-05-20 LAB — CBG MONITORING, ED: Glucose-Capillary: 80 mg/dL (ref 70–99)

## 2024-05-20 LAB — COMPREHENSIVE METABOLIC PANEL WITH GFR
ALT: 48 U/L — ABNORMAL HIGH (ref 0–44)
AST: 31 U/L (ref 15–41)
Albumin: 4.1 g/dL (ref 3.5–5.0)
Alkaline Phosphatase: 84 U/L (ref 38–126)
Anion gap: 13 (ref 5–15)
BUN: 10 mg/dL (ref 6–20)
CO2: 26 mmol/L (ref 22–32)
Calcium: 9.8 mg/dL (ref 8.9–10.3)
Chloride: 101 mmol/L (ref 98–111)
Creatinine, Ser: 0.96 mg/dL (ref 0.61–1.24)
GFR, Estimated: >60 mL/min (ref >60)
Glucose, Bld: 81 mg/dL (ref 70–99)
Potassium: 3.7 mmol/L (ref 3.5–5.1)
Sodium: 140 mmol/L (ref 135–145)
Total Bilirubin: 0.7 mg/dL (ref 0.0–1.2)
Total Protein: 7.6 g/dL (ref 6.5–8.1)

## 2024-05-20 LAB — PRO BRAIN NATRIURETIC PEPTIDE: Pro Brain Natriuretic Peptide: 224 pg/mL (ref <300.0)

## 2024-05-20 LAB — CBC WITH DIFFERENTIAL/PLATELET
Abs Immature Granulocytes: 0.01 K/uL (ref 0.00–0.07)
Basophils Absolute: 0 K/uL (ref 0.0–0.1)
Basophils Relative: 1 %
Eosinophils Absolute: 0.1 K/uL (ref 0.0–0.5)
Eosinophils Relative: 1 %
HCT: 44.9 % (ref 39.0–52.0)
Hemoglobin: 15.3 g/dL (ref 13.0–17.0)
Immature Granulocytes: 0 %
Lymphocytes Relative: 28 %
Lymphs Abs: 1.7 K/uL (ref 0.7–4.0)
MCH: 29.8 pg (ref 26.0–34.0)
MCHC: 34.1 g/dL (ref 30.0–36.0)
MCV: 87.5 fL (ref 80.0–100.0)
Monocytes Absolute: 0.5 K/uL (ref 0.1–1.0)
Monocytes Relative: 9 %
Neutro Abs: 3.8 K/uL (ref 1.7–7.7)
Neutrophils Relative %: 61 %
Platelets: 339 K/uL (ref 150–400)
RBC: 5.13 MIL/uL (ref 4.22–5.81)
RDW: 12.5 % (ref 11.5–15.5)
WBC: 6.2 K/uL (ref 4.0–10.5)
nRBC: 0 % (ref 0.0–0.2)

## 2024-05-20 MED ORDER — FUROSEMIDE 20 MG PO TABS: 20.0000 mg | ORAL_TABLET | Freq: Every day | ORAL | 0 refills | Status: AC

## 2024-05-20 MED ORDER — VALSARTAN 80 MG PO TABS: 80.0000 mg | ORAL_TABLET | Freq: Every day | ORAL | 0 refills | Status: AC

## 2024-05-20 NOTE — ED Notes (Signed)
 Pt. Unable to urinate, pt. Aware we need a urine sample

## 2024-05-20 NOTE — ED Triage Notes (Signed)
 Swelling in left foot started 10 days ago  Left side flank pain x 2 days  Reports frequent urination  Ibuprofen at 2 pm some improvement  BP high in triage, not on BP meds

## 2024-05-20 NOTE — Discharge Instructions (Signed)

## 2024-05-20 NOTE — ED Provider Notes (Signed)
 [place] EMERGENCY DEPARTMENT AT [facility] Provider Note   CSN: [id number] Arrival date & time: 05/20/24  8167     Patient presents with: Foot Swelling and Flank Pain   Brandon Cole is a 38 y.o. male.    Flank Pain  Patient with a history of hypertension.  Noncompliant with his medications.  Does have some pain and swelling in his left foot.  Feels if it is on the ball of his foot.  No injury.  Has had somewhat frequent urination.  Has some left flank pain and back pain.  Took some Motrin with some relief of pain.  No difficulty breathing.  No chest pain.  Does not feel like gout that he has had previously.  States he did not take his blood pressure medicines because he was worried about the side effects.  Not previously diagnosed with diabetes.    Past Medical History:  Diagnosis Date   Hypertension     Prior to Admission medications   Medication Sig Start Date End Date Taking? Authorizing Provider  furosemide (LASIX) 20 MG tablet Take 1 tablet (20 mg total) by mouth daily. 05/20/24  Yes Patsey Lot, MD  valsartan (DIOVAN) 80 MG tablet Take 1 tablet (80 mg total) by mouth daily. 05/20/24 06/19/24 Yes Patsey Lot, MD    Allergies: Patient has no known allergies.    Review of Systems  Genitourinary:  Positive for flank pain.    Updated Vital Signs BP (!) 171/109   Pulse (!) 102   Temp (!) 97.5 F (36.4 C)   Resp 19   SpO2 98%   Physical Exam Vitals and nursing note reviewed.  Cardiovascular:     Rate and Rhythm: Normal rate.  Pulmonary:     Effort: Pulmonary effort is normal.  Abdominal:     Tenderness: There is no abdominal tenderness.  Musculoskeletal:     Comments: Some edema bilateral lower extremities, slightly worse on the left.  Mild pain with movement at the toes.  Not irritable.  Skin:    Capillary Refill: Capillary refill takes less than 2 seconds.  Neurological:     Mental Status: He is alert and oriented to person, place,  and time.     (all labs ordered are listed, but only abnormal results are displayed) Labs Reviewed  COMPREHENSIVE METABOLIC PANEL WITH GFR - Abnormal; Notable for the following components:      Result Value   ALT 48 (*)    All other components within normal limits  URINALYSIS, ROUTINE W REFLEX MICROSCOPIC  PRO BRAIN NATRIURETIC PEPTIDE  CBC WITH DIFFERENTIAL/PLATELET  CBG MONITORING, ED    EKG: None  Radiology: DG Chest 2 View Result Date: 05/20/2024 EXAM: 2 VIEW(S) XRAY OF THE CHEST 05/20/2024 08:40:00 PM COMPARISON: None available. CLINICAL HISTORY: edema. Swelling in left foot started 10 days ago, patient states chronic gout. Left side flank pain x 2 days. States discomfort started 10 days ago. Reports frequent discolored urination. Ibuprofen at 2 pm some improvement. BP high in triage, not on BP meds. FINDINGS: LUNGS AND PLEURA: No focal pulmonary opacity. No pulmonary edema. No pleural effusion. No pneumothorax. HEART AND MEDIASTINUM: No acute abnormality of the cardiac and mediastinal silhouettes. BONES AND SOFT TISSUES: No acute osseous abnormality. IMPRESSION: 1. No acute process. Electronically signed by: Dorethia Molt MD 05/20/2024 08:47 PM EDT RP Workstation: [id number]   DG Foot Complete Left Result Date: 05/20/2024 EXAM: 3 or more VIEW(S) XRAY OF THE left FOOT 05/20/2024 08:40:00 PM COMPARISON: None  available. CLINICAL HISTORY: Swelling in left foot started 10 days ago, patient states chronic gout. Left side flank pain x 2 days. States discomfort started 10 days ago. Reports frequent discolored urination. Ibuprofen at 2 pm some improvement. BP high in triage, not on BP meds. FINDINGS: BONES AND JOINTS: No acute fracture. No focal osseous lesion. No joint dislocation. SOFT TISSUES: Soft tissue swelling throughout the forefoot. No retained radiopaque foreign body. No subcutaneous gas. IMPRESSION: 1. Soft tissue swelling throughout the forefoot. Electronically signed by: Dorethia Molt MD 05/20/2024 08:46 PM EDT RP Workstation: [id number]     Procedures   Medications Ordered in the ED - No data to display                                  Medical Decision Making Amount and/or Complexity of Data Reviewed Labs: ordered. Radiology: ordered.  Risk Prescription drug management.   Patient with edema of feet particularly on left.  Hypertension tachycardia.  Noncompliant with medications.  Differential diagnosis did include diabetes.  States he was worried he had it.  However initial CBG is 80.  Will get x-ray of the chest.  Will get foot x-ray.  Will get basic blood work.  X-ray reassuring.  Blood work overall reassuring.  He is hypertensive.  Has been off his medicines.  Looks like he had medicines only through urgent cares in the ER.  Will start on medicine per our uncontrolled hypertension protocol.  Also give short course of Lasix to help with some of the extra fluid.  Discharge home with hopefully short-term follow-up.     Final diagnoses:  Uncontrolled hypertension    ED Discharge Orders          Ordered    AMB Referral VBCI Care Management        05/20/24 2228    valsartan (DIOVAN) 80 MG tablet  Daily        05/20/24 2230    furosemide (LASIX) 20 MG tablet  Daily        05/20/24 2230               Patsey Lot, MD 05/20/24 2317

## 2024-05-22 ENCOUNTER — Telehealth: Payer: Self-pay

## 2024-05-22 NOTE — Progress Notes (Unsigned)
 Complex Care Management Note Care Guide Note  05/22/2024 Name: Brandon Cole MRN: [id number] DOB: April 04, 1986   Complex Care Management Outreach Attempts: An unsuccessful telephone outreach was attempted today to offer the patient information about available complex care management services.  Follow Up Plan:  Additional outreach attempts will be made to offer the patient complex care management information and services.   Encounter Outcome:  No Answer  Dreama [facility]  [facility], [facility] VBCI Assistant Direct Dial: [phone]  Fax: [phone]

## 2024-05-24 NOTE — Progress Notes (Unsigned)
 Complex Care Management Note Care Guide Note  05/24/2024 Name: Brandon Cole MRN: [id number] DOB: 1986-07-21   Complex Care Management Outreach Attempts: A second unsuccessful outreach was attempted today to offer the patient with information about available complex care management services.  Follow Up Plan:  Additional outreach attempts will be made to offer the patient complex care management information and services.   Encounter Outcome:  No Answer  Dreama [facility]wood [facility]  [facility], [facility] VBCI Assistant Direct Dial: [phone]  Fax: [phone]

## 2024-05-25 NOTE — Progress Notes (Signed)
 Complex Care Management Note Care Guide Note  05/25/2024 Name: Brandon Cole MRN: [id number] DOB: Dec 17, 1985   Complex Care Management Outreach Attempts: A third unsuccessful outreach was attempted today to offer the patient with information about available complex care management services.  Follow Up Plan:  No further outreach attempts will be made at this time. We have been unable to contact the patient to offer or enroll patient in complex care management services.  Encounter Outcome:  No Answer  Dreama [facility]  [facility], [facility] VBCI Assistant Direct Dial: [phone]  Fax: [phone]

## 2024-06-11 ENCOUNTER — Emergency Department (HOSPITAL_BASED_OUTPATIENT_CLINIC_OR_DEPARTMENT_OTHER)
Admission: EM | Admit: 2024-06-11 | Discharge: 2024-06-11 | Disposition: A | Discharge status: Home / Self Care | Attending: Emergency Medicine | Admitting: Emergency Medicine

## 2024-06-11 ENCOUNTER — Encounter (HOSPITAL_BASED_OUTPATIENT_CLINIC_OR_DEPARTMENT_OTHER): Payer: Self-pay | Admitting: Emergency Medicine

## 2024-06-11 ENCOUNTER — Other Ambulatory Visit: Payer: Self-pay

## 2024-06-11 DIAGNOSIS — M5432 Sciatica, left side: Secondary | ICD-10-CM

## 2024-06-11 DIAGNOSIS — M5442 Lumbago with sciatica, left side: Secondary | ICD-10-CM | POA: Diagnosis not present

## 2024-06-11 DIAGNOSIS — M549 Dorsalgia, unspecified: Secondary | ICD-10-CM | POA: Diagnosis present

## 2024-06-11 MED ORDER — LIDOCAINE 5 % EX PTCH
1.0000 | MEDICATED_PATCH | CUTANEOUS | Status: DC
  Administered 2024-06-11: 1 via TRANSDERMAL
  Filled 2024-06-11: qty 1

## 2024-06-11 MED ORDER — CYCLOBENZAPRINE HCL 10 MG PO TABS: 10.0000 mg | ORAL_TABLET | Freq: Two times a day (BID) | ORAL | 0 refills | Status: AC | PRN

## 2024-06-11 MED ORDER — LIDOCAINE 5 % EX PTCH: 1.0000 | MEDICATED_PATCH | CUTANEOUS | 0 refills | Status: AC

## 2024-06-11 MED ORDER — IBUPROFEN 800 MG PO TABS
800.0000 mg | ORAL_TABLET | Freq: Once | ORAL | Status: DC
Start: 2024-06-11 — End: 2024-06-11
  Filled 2024-06-11: qty 1

## 2024-06-11 MED ORDER — ACETAMINOPHEN 500 MG PO TABS
1000.0000 mg | ORAL_TABLET | Freq: Once | ORAL | Status: AC
  Administered 2024-06-11: 1000 mg via ORAL
  Filled 2024-06-11: qty 2

## 2024-06-11 NOTE — Discharge Instructions (Addendum)
 Evaluation is concerning for sciatica.  Please follow-up with her PCP.  As we discussed continue taking ibuprofen or Aleve  (not both) and Tylenol.  Also sent Lidoderm  patches and muscle relaxers to your pharmacy.

## 2024-06-11 NOTE — ED Triage Notes (Signed)
 C/o sciatica like pain running down left leg. Denies injury to area. Hx of same in R leg.

## 2024-06-11 NOTE — ED Provider Notes (Signed)
 [place] EMERGENCY DEPARTMENT AT [facility] Provider Note   CSN: [id number] Arrival date & time: 06/11/24  1512     Patient presents with: Back Pain  HPI Jes Costales is a 38 y.o. male presenting for back pain.  Started a couple weeks ago but worse in the last couple days.  Pain originates in the left mid buttock and does radiate down the right leg at times.  Worse with walking.  Denies any trauma.  Denies any abdominal pain.  Denies GI or urinary issues.  Denies fever.  Reports history of sciatic pain on the right side he reports that this feels very similar.  Also requesting a work note as he does a lot of standing at work.    Back Pain      Prior to Admission medications   Medication Sig Start Date End Date Taking? Authorizing Provider  cyclobenzaprine (FLEXERIL) 10 MG tablet Take 1 tablet (10 mg total) by mouth 2 (two) times daily as needed for muscle spasms. 06/11/24  Yes Anelise Staron K, PA-C  lidocaine  (LIDODERM ) 5 % Place 1 patch onto the skin daily. Remove & Discard patch within 12 hours or as directed by MD 06/11/24  Yes Manasa Spease K, PA-C  furosemide (LASIX) 20 MG tablet Take 1 tablet (20 mg total) by mouth daily. 05/20/24   Patsey Lot, MD  valsartan (DIOVAN) 80 MG tablet Take 1 tablet (80 mg total) by mouth daily. 05/20/24 06/19/24  Patsey Lot, MD    Allergies: Patient has no known allergies.    Review of Systems  Musculoskeletal:  Positive for back pain.    Updated Vital Signs BP (!) 155/100   Pulse (!) 109   Temp 98 F (36.7 C)   Resp 18   SpO2 98%   Physical Exam Constitutional:      Appearance: Normal appearance.  HENT:     Head: Normocephalic.     Nose: Nose normal.  Eyes:     Conjunctiva/sclera: Conjunctivae normal.  Pulmonary:     Effort: Pulmonary effort is normal.  Abdominal:     General: There is no distension.     Palpations: Abdomen is soft.     Tenderness: There is no abdominal tenderness. There is no  right CVA tenderness or left CVA tenderness.  Musculoskeletal:       Back:  Neurological:     Mental Status: He is alert.  Psychiatric:        Mood and Affect: Mood normal.     (all labs ordered are listed, but only abnormal results are displayed) Labs Reviewed - No data to display  EKG: None  Radiology: No results found.   Procedures   Medications Ordered in the ED  ibuprofen (ADVIL) tablet 800 mg (800 mg Oral Not Given 06/11/24 1632)  acetaminophen (TYLENOL) tablet 1,000 mg (has no administration in time range)  lidocaine  (LIDODERM ) 5 % 1 patch (has no administration in time range)                                    Medical Decision Making Risk Prescription drug management.   38 year old well-appearing male presenting for left lower back pain.  Exam notable for pain elicited with palpation overlying the left sciatic notch.  Suspect this is sciatic pain. Advised supportive treatment with NSAIDs at home.  Also sent Lidoderm  patches and muscle relaxers at his request to his pharmacy.  Advised  him to follow-up with his PCP.  Discharge.     Final diagnoses:  Sciatic pain, left    ED Discharge Orders          Ordered    lidocaine  (LIDODERM ) 5 %  Every 24 hours        06/11/24 1652    cyclobenzaprine (FLEXERIL) 10 MG tablet  2 times daily PRN        06/11/24 1652               Lang Norleen POUR, PA-C 06/11/24 1652    Mannie Pac T, DO 06/18/24 0915
